# Patient Record
Sex: Male | Born: 1960 | Race: White | Hispanic: No | Marital: Married | Smoking: Never smoker
Health system: Southern US, Community
[De-identification: ages and names within clinical notes are randomized; demographics above are authoritative.]

## PROBLEM LIST (undated history)

## (undated) DIAGNOSIS — E785 Hyperlipidemia, unspecified: Secondary | ICD-10-CM

## (undated) DIAGNOSIS — Z8489 Family history of other specified conditions: Secondary | ICD-10-CM

## (undated) DIAGNOSIS — R51 Headache: Secondary | ICD-10-CM

## (undated) DIAGNOSIS — E7800 Pure hypercholesterolemia, unspecified: Secondary | ICD-10-CM

## (undated) DIAGNOSIS — I10 Essential (primary) hypertension: Secondary | ICD-10-CM

## (undated) DIAGNOSIS — Z9989 Dependence on other enabling machines and devices: Secondary | ICD-10-CM

## (undated) DIAGNOSIS — G4733 Obstructive sleep apnea (adult) (pediatric): Secondary | ICD-10-CM

## (undated) DIAGNOSIS — M1990 Unspecified osteoarthritis, unspecified site: Secondary | ICD-10-CM

## (undated) DIAGNOSIS — R519 Headache, unspecified: Secondary | ICD-10-CM

## (undated) DIAGNOSIS — E039 Hypothyroidism, unspecified: Secondary | ICD-10-CM

## (undated) HISTORY — PX: ANTERIOR CRUCIATE LIGAMENT REPAIR: SHX115

## (undated) HISTORY — PX: SHOULDER ARTHROSCOPY W/ ACROMIAL REPAIR: SUR94

## (undated) HISTORY — PX: BILATERAL CARPAL TUNNEL RELEASE: SHX6508

## (undated) HISTORY — PX: KNEE ARTHROSCOPY: SHX127

## (undated) HISTORY — PX: REPAIR ANKLE LIGAMENT: SUR1187

## (undated) HISTORY — PX: ARTHROSCOPIC REPAIR ACL: SUR80

## (undated) HISTORY — PX: COLONOSCOPY W/ POLYPECTOMY: SHX1380

## (undated) HISTORY — PX: KNEE ARTHROSCOPY: SUR90

---
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
## 1999-03-04 ENCOUNTER — Ambulatory Visit (HOSPITAL_BASED_OUTPATIENT_CLINIC_OR_DEPARTMENT_OTHER): Admission: RE | Admit: 1999-03-04 | Discharge: 1999-03-04 | Payer: Self-pay | Admitting: Orthopedic Surgery

## 1999-04-14 ENCOUNTER — Ambulatory Visit (HOSPITAL_BASED_OUTPATIENT_CLINIC_OR_DEPARTMENT_OTHER): Admission: RE | Admit: 1999-04-14 | Discharge: 1999-04-14 | Payer: Self-pay | Admitting: Orthopedic Surgery

## 2007-06-14 ENCOUNTER — Ambulatory Visit (HOSPITAL_BASED_OUTPATIENT_CLINIC_OR_DEPARTMENT_OTHER): Admission: RE | Admit: 2007-06-14 | Discharge: 2007-06-14 | Payer: Self-pay | Admitting: Orthopedic Surgery

## 2011-01-11 NOTE — Op Note (Signed)
NAMEMACKLIN, Darryl [name]                ACCOUNT NO.:  [id number]   MEDICAL RECORD NO.:  [id number]          PATIENT TYPE:  AMB   LOCATION:  DSC                          FACILITY:  [facility]   PHYSICIAN:  Loreta Ave, M.D. DATE OF BIRTH:  1961-07-19   DATE OF PROCEDURE:  06/14/2007  DATE OF DISCHARGE:  06/14/2007                               OPERATIVE REPORT   PREOPERATIVE DIAGNOSIS:  Left knee lateral meniscus tear.  Previous ACL  (anterior cruciate ligament) reconstruction.   POSTOPERATIVE DIAGNOSIS:  Left knee lateral meniscus tear.  Previous ACL  (anterior cruciate ligament) reconstruction with intact functional  graft.  Grade III, even some grade IV changes patellofemoral joint,  mostly on the trochlea.  Marked complex tearing lateral meniscus.  Previous partial medial meniscectomy without recurrent tears there.  Some grade II changes medial and lateral compartments.   PROCEDURE:  Left knee exam under anesthesia, arthroscopy, chondroplasty  primarily patellofemoral joint.  Removal of chondral loose bodies.  Extensive partial lateral meniscectomy.  Assessment of ACL graft.   SURGEON:  Loreta Ave, M.D.   ASSISTANT:  Genene Churn. Denton Meek.   ANESTHESIA:  General.   BLOOD LOSS:  Minimal.   SPECIMENS:  None.   CULTURES:  None.   COMPLICATIONS:  None.   DRESSING:  Soft compressive.   PROCEDURE IN DETAIL:  The patient was brought to the operating room,  placed on the operating table in supine position.  After adequate  anesthesia had been obtained, left knee examined.  Good endpoint with  Lachman and drawer.  No rotary instability.  All the ligaments stable.  Tourniquet and leg holder applied.  Leg prepped and draped in the usual  sterile fashion.  Three portals created, one superolateral, one each  mediolateral and parapatellar.  Inflow catheter introduced and knee was  distended and arthroscope introduced and knee inspected.  Good  patellofemoral tracking with only  grade II changes on the patella but  the whole lateral trochlea had grade III changes even some grade IV.  Debrided back to healthy tissue, removing all chondral loose bodies.  Tracking assessed and lateral release not indicated.  Some leftover  adhesions in front of the knee from previous intervention debrided.  Medial meniscus had had a partial meniscectomy without recurrent tears.  Some grade II changes there.  ACL graft intact and functional without  impingement or instability.  Laterally marked complex tearing of the  entire anterior half of the lateral meniscus and half of the posterior  half.  Entire anterior half removed of tip of medial meniscus removing  about half of the posterior half.  A little bit of grade II change on  the condyle.  At completion the entire knee  examined.  No other findings appreciated.  Instruments and fluid  removed.  Portals and knee injected with Marcaine.  Portals closed with  4-0 nylon.  Sterile compressive dressing applied.  Anesthesia reversed.  Brought to the recovery room.  Tolerated surgery well.  No  complications.      Loreta Ave, M.D.  Electronically Signed  DFM/MEDQ  D:  06/14/2007  T:  06/15/2007  Job:  [id number]

## 2011-06-08 LAB — POCT HEMOGLOBIN-HEMACUE
Hemoglobin: 14.6
Operator id: 128471

## 2011-07-26 ENCOUNTER — Other Ambulatory Visit: Payer: Self-pay | Admitting: Orthopedic Surgery

## 2011-07-26 ENCOUNTER — Ambulatory Visit
Admission: RE | Admit: 2011-07-26 | Discharge: 2011-07-26 | Disposition: A | Discharge status: Home / Self Care | Payer: BC Managed Care – PPO | Source: Ambulatory Visit | Attending: Orthopedic Surgery | Admitting: Orthopedic Surgery

## 2011-07-26 DIAGNOSIS — R52 Pain, unspecified: Secondary | ICD-10-CM

## 2017-08-14 NOTE — H&P (Signed)
Otolaryngology Clinic Note  HPI:    Darryl [name] is a 56 y.o. male patient of Maren ReamerJames Youngchull Kim, MD for evaluation of nasal obstruction.  He recalls that we saw him maybe 15+ years ago for sinus infections.  He uses an antihistamine daily, and has been using Flonase for maybe 4 years.  He does not think he has had a true sinus infection in several years.  He does not feel like he has allergies including itchy eyes, itchy skin, and nasal congestion.  He also has issues with his nose blocking up, especially nocturnally.  He uses CPAP for sleep apnea but does not use the humidifier.  In the morning, he will blow out some dark brownish red material.  He has not had any sinus x-rays.  He does not smoke.  He always seems to have extra phlegm in his throat.  He has a past history of reflux but this is improved when he uses his CPAP, as is his rest quality.  No facial pain or pressure.  No history of nasal polyps or asthma. PMH/Meds/All/SocHx/FamHx/ROS:   Past Medical History      Past Medical History:  Diagnosis Date  . Allergic rhinitis   . High cholesterol   . Obstructive sleep apnea       Past Surgical History       Past Surgical History:  Procedure Laterality Date  . CARPAL TUNNEL RELEASE    . FOOT SURGERY    . KNEE SURGERY    . SHOULDER SURGERY    . WISDOM TOOTH EXTRACTION        No family history of bleeding disorders, wound healing problems or difficulty with anesthesia.   Social History  Social History        Social History  . Marital status: N/A    Spouse name: N/A  . Number of children: N/A  . Years of education: N/A      Occupational History  . Not on file.       Social History Main Topics  . Smoking status: Never Smoker  . Smokeless tobacco: Never Used  . Alcohol use Yes  . Drug use: Unknown  . Sexual activity: Not on file       Other Topics Concern  . Not on file      Social History Narrative  . No narrative on file        Current Outpatient Prescriptions:  .  levothyroxine (SYNTHROID, LEVOTHROID) 100 MCG tablet, Take 100 mcg by mouth daily., Disp: , Rfl:  .  simvastatin (ZOCOR) 10 MG tablet, Take 10 mg by mouth daily., Disp: , Rfl:   A complete ROS was performed with pertinent positives/negatives noted in the HPI. The remainder of the ROS are negative.    Physical Exam:    BP (!) 148/102 (Site: Left arm)   Pulse 71   Ht 1.854 m (6\' 1" )   Wt (!) 137.9 kg (304 lb)   BMI 40.11 kg/m  He is tall and stocky.  Mental status is sharp.  He hears well in conversational speech.  Voice is clear and respirations unlabored through the nose.  The head is atraumatic and neck supple.  Cranial nerves intact.  Ear canals are clear with normal drums.  Anterior nose shows a straight septum with a small left chondral ethmoid spur.  He has moderately bulky inferior turbinates which are objectively and subjectively improved with Afrin spray.  No polyps or active drainage.  Oral cavity and pharynx  clear with teeth in good repair.  Neck unremarkable.    Noncontrast CT scan of the paranasal sinuses shows a small left chondral ethmoid spur as above.  He has essentially no sinus mucosal thickening.  Inferior turbinates are somewhat bulky.  Ostiomeatal complex is patent on both sides.  Impression & Plans:   Allergic rhinitis.  Obstructive sleep apnea.  Hypertrophic inferior turbinates.  Plan: If he is going to use an antihistamine every day, he should rotate to a different one each month.  I would like him to continue Flonase.  I have given him nasal hygiene instructions.  I definitely think he should use his humidifier on the CPAP machine.  He might try Nasalcrom prophylactically before allergy exposure.  I would have him consider reflux as a possible contribution to chronic pharyngeal phlegm.  He has consistent relief of his concerning symptoms with Afrin spray.  I think we could recommend bilateral SMR of  inferior turbinates.  I discussed this with him in basic detail including risks and complications.  Questions were answered and informed consent was obtained.  He has hydrocodone at home left over from carpal tunnel surgery.  Because he has sleep apnea, we will observe him 23 hours after his surgery, remove his nasal packs the following morning, and then discharge him to home in care of family.   [name], MD  07/18/2017

## 2017-08-15 ENCOUNTER — Other Ambulatory Visit: Payer: Self-pay

## 2017-08-15 ENCOUNTER — Encounter (HOSPITAL_COMMUNITY): Payer: Self-pay | Admitting: *Deleted

## 2017-08-16 ENCOUNTER — Ambulatory Visit (HOSPITAL_COMMUNITY): Payer: BC Managed Care – PPO | Admitting: Certified Registered Nurse Anesthetist

## 2017-08-16 ENCOUNTER — Encounter (HOSPITAL_COMMUNITY)
Admission: RE | Disposition: A | Discharge status: Home / Self Care | Payer: Self-pay | Source: Ambulatory Visit | Attending: Otolaryngology

## 2017-08-16 ENCOUNTER — Ambulatory Visit (HOSPITAL_COMMUNITY)
Admission: RE | Admit: 2017-08-16 | Discharge: 2017-08-17 | Disposition: A | Discharge status: Home / Self Care | Payer: BC Managed Care – PPO | Source: Ambulatory Visit | Attending: Otolaryngology | Admitting: Otolaryngology

## 2017-08-16 ENCOUNTER — Encounter (HOSPITAL_COMMUNITY): Payer: Self-pay | Admitting: *Deleted

## 2017-08-16 DIAGNOSIS — G4733 Obstructive sleep apnea (adult) (pediatric): Secondary | ICD-10-CM | POA: Diagnosis not present

## 2017-08-16 DIAGNOSIS — K219 Gastro-esophageal reflux disease without esophagitis: Secondary | ICD-10-CM | POA: Diagnosis not present

## 2017-08-16 DIAGNOSIS — J309 Allergic rhinitis, unspecified: Secondary | ICD-10-CM | POA: Diagnosis not present

## 2017-08-16 DIAGNOSIS — J343 Hypertrophy of nasal turbinates: Secondary | ICD-10-CM | POA: Insufficient documentation

## 2017-08-16 DIAGNOSIS — E7800 Pure hypercholesterolemia, unspecified: Secondary | ICD-10-CM | POA: Diagnosis not present

## 2017-08-16 HISTORY — DX: Unspecified osteoarthritis, unspecified site: M19.90

## 2017-08-16 HISTORY — DX: Family history of other specified conditions: Z84.89

## 2017-08-16 HISTORY — DX: Headache: R51

## 2017-08-16 HISTORY — PX: NASAL TURBINATE REDUCTION: SHX2072

## 2017-08-16 HISTORY — DX: Headache, unspecified: R51.9

## 2017-08-16 HISTORY — DX: Hypothyroidism, unspecified: E03.9

## 2017-08-16 HISTORY — DX: Dependence on other enabling machines and devices: Z99.89

## 2017-08-16 HISTORY — DX: Pure hypercholesterolemia, unspecified: E78.00

## 2017-08-16 HISTORY — DX: Obstructive sleep apnea (adult) (pediatric): G47.33

## 2017-08-16 LAB — CBC
HEMATOCRIT: 43.6 % (ref 39.0–52.0)
Hemoglobin: 14.8 g/dL (ref 13.0–17.0)
MCH: 31.1 pg (ref 26.0–34.0)
MCHC: 33.9 g/dL (ref 30.0–36.0)
MCV: 91.6 fL (ref 78.0–100.0)
PLATELETS: 176 10*3/uL (ref 150–400)
RBC: 4.76 MIL/uL (ref 4.22–5.81)
RDW: 13.1 % (ref 11.5–15.5)
WBC: 4.5 10*3/uL (ref 4.0–10.5)

## 2017-08-16 LAB — BASIC METABOLIC PANEL
ANION GAP: 6 (ref 5–15)
BUN: 22 mg/dL — ABNORMAL HIGH (ref 6–20)
CALCIUM: 8.8 mg/dL — AB (ref 8.9–10.3)
CO2: 28 mmol/L (ref 22–32)
CREATININE: 1.34 mg/dL — AB (ref 0.61–1.24)
Chloride: 106 mmol/L (ref 101–111)
GFR, EST NON AFRICAN AMERICAN: 58 mL/min — AB (ref >60)
Glucose, Bld: 128 mg/dL — ABNORMAL HIGH (ref 65–99)
Potassium: 5 mmol/L (ref 3.5–5.1)
Sodium: 140 mmol/L (ref 135–145)

## 2017-08-16 SURGERY — EXCISION, NASAL TURBINATE, SUBMUCOSAL
Anesthesia: General

## 2017-08-16 MED ORDER — MIDAZOLAM HCL 2 MG/2ML IJ SOLN
INTRAMUSCULAR | Status: AC
  Filled 2017-08-16: qty 2

## 2017-08-16 MED ORDER — ONDANSETRON HCL 4 MG/2ML IJ SOLN: 4.0000 mg | Freq: Four times a day (QID) | INTRAMUSCULAR | Status: DC | PRN

## 2017-08-16 MED ORDER — SURGILUBE EX GEL
CUTANEOUS | Status: DC | PRN
  Administered 2017-08-16: 1 via TOPICAL

## 2017-08-16 MED ORDER — FENTANYL CITRATE (PF) 250 MCG/5ML IJ SOLN
INTRAMUSCULAR | Status: DC | PRN
  Administered 2017-08-16: 100 ug via INTRAVENOUS
  Administered 2017-08-16: 50 ug via INTRAVENOUS

## 2017-08-16 MED ORDER — LIDOCAINE-EPINEPHRINE 1 %-1:100000 IJ SOLN
INTRAMUSCULAR | Status: AC
  Filled 2017-08-16: qty 1

## 2017-08-16 MED ORDER — MIDAZOLAM HCL 2 MG/2ML IJ SOLN
INTRAMUSCULAR | Status: DC | PRN
  Administered 2017-08-16 (×2): 1 mg via INTRAVENOUS

## 2017-08-16 MED ORDER — HYDROMORPHONE HCL 1 MG/ML IJ SOLN
0.2500 mg | INTRAMUSCULAR | Status: DC | PRN
  Administered 2017-08-16: 0.25 mg via INTRAVENOUS

## 2017-08-16 MED ORDER — IBUPROFEN 100 MG/5ML PO SUSP
400.0000 mg | Freq: Four times a day (QID) | ORAL | Status: DC | PRN
Start: 2017-08-16 — End: 2017-08-17

## 2017-08-16 MED ORDER — DEXAMETHASONE SODIUM PHOSPHATE 10 MG/ML IJ SOLN
INTRAMUSCULAR | Status: DC | PRN
  Administered 2017-08-16: 10 mg via INTRAVENOUS

## 2017-08-16 MED ORDER — CEFAZOLIN SODIUM-DEXTROSE 2-4 GM/100ML-% IV SOLN: 2.0000 g | INTRAVENOUS | Status: DC

## 2017-08-16 MED ORDER — OXYMETAZOLINE HCL 0.05 % NA SOLN
2.0000 | NASAL | Status: AC | PRN
  Administered 2017-08-16 (×2): 2 via NASAL
  Filled 2017-08-16: qty 15

## 2017-08-16 MED ORDER — LACTATED RINGERS IV SOLN
INTRAVENOUS | Status: DC
  Administered 2017-08-16: 08:00:00 via INTRAVENOUS

## 2017-08-16 MED ORDER — SUGAMMADEX SODIUM 200 MG/2ML IV SOLN
INTRAVENOUS | Status: DC | PRN
  Administered 2017-08-16: 300 mg via INTRAVENOUS

## 2017-08-16 MED ORDER — LEVOTHYROXINE SODIUM 100 MCG PO TABS
100.0000 ug | ORAL_TABLET | Freq: Every day | ORAL | Status: DC
  Filled 2017-08-16: qty 1

## 2017-08-16 MED ORDER — HYDROCODONE-ACETAMINOPHEN 5-325 MG PO TABS: 1.0000 | ORAL_TABLET | ORAL | Status: DC | PRN

## 2017-08-16 MED ORDER — 0.9 % SODIUM CHLORIDE (POUR BTL) OPTIME
TOPICAL | Status: DC | PRN
  Administered 2017-08-16: 1000 mL

## 2017-08-16 MED ORDER — HYDROCODONE-ACETAMINOPHEN 5-325 MG PO TABS: 2.0000 | ORAL_TABLET | Freq: Once | ORAL | Status: DC

## 2017-08-16 MED ORDER — LIDOCAINE 2% (20 MG/ML) 5 ML SYRINGE
INTRAMUSCULAR | Status: AC
  Filled 2017-08-16: qty 5

## 2017-08-16 MED ORDER — LIDOCAINE 2% (20 MG/ML) 5 ML SYRINGE
INTRAMUSCULAR | Status: DC | PRN
  Administered 2017-08-16: 50 mg via INTRAVENOUS

## 2017-08-16 MED ORDER — OXYMETAZOLINE HCL 0.05 % NA SOLN
NASAL | Status: AC
  Filled 2017-08-16: qty 15

## 2017-08-16 MED ORDER — LIDOCAINE-EPINEPHRINE 1 %-1:100000 IJ SOLN
INTRAMUSCULAR | Status: DC | PRN
  Administered 2017-08-16: 3 mL

## 2017-08-16 MED ORDER — PROPOFOL 10 MG/ML IV BOLUS
INTRAVENOUS | Status: DC | PRN
  Administered 2017-08-16: 200 mg via INTRAVENOUS

## 2017-08-16 MED ORDER — BACITRACIN ZINC 500 UNIT/GM EX OINT
TOPICAL_OINTMENT | CUTANEOUS | Status: AC
  Filled 2017-08-16: qty 28.35

## 2017-08-16 MED ORDER — OXYMETAZOLINE HCL 0.05 % NA SOLN
NASAL | Status: DC | PRN
  Administered 2017-08-16: 1

## 2017-08-16 MED ORDER — ROCURONIUM BROMIDE 10 MG/ML (PF) SYRINGE
PREFILLED_SYRINGE | INTRAVENOUS | Status: DC | PRN
  Administered 2017-08-16: 50 mg via INTRAVENOUS

## 2017-08-16 MED ORDER — HYDROMORPHONE HCL 1 MG/ML IJ SOLN
INTRAMUSCULAR | Status: AC
  Filled 2017-08-16: qty 1

## 2017-08-16 MED ORDER — DEXTROSE-NACL 5-0.45 % IV SOLN
INTRAVENOUS | Status: DC
Start: 2017-08-16 — End: 2017-08-17
  Administered 2017-08-16: 13:00:00 via INTRAVENOUS

## 2017-08-16 MED ORDER — ONDANSETRON HCL 4 MG/2ML IJ SOLN
INTRAMUSCULAR | Status: DC | PRN
  Administered 2017-08-16: 4 mg via INTRAVENOUS

## 2017-08-16 MED ORDER — PROPOFOL 10 MG/ML IV BOLUS
INTRAVENOUS | Status: AC
  Filled 2017-08-16: qty 20

## 2017-08-16 MED ORDER — PHENOL 1.4 % MT LIQD
1.0000 | OROMUCOSAL | Status: DC | PRN
Start: 2017-08-16 — End: 2017-08-17
  Administered 2017-08-16: 1 via OROMUCOSAL
  Filled 2017-08-16 (×2): qty 177

## 2017-08-16 MED ORDER — OXYMETAZOLINE HCL 0.05 % NA SOLN: 2.0000 | NASAL | Status: DC | PRN

## 2017-08-16 MED ORDER — CARBOXYMETHYLCELLUL-GLYCERIN 0.5-0.9 % OP SOLN: 2.0000 [drp] | Freq: Three times a day (TID) | OPHTHALMIC | Status: DC

## 2017-08-16 MED ORDER — CEFAZOLIN SODIUM-DEXTROSE 1-4 GM/50ML-% IV SOLN
1.0000 g | Freq: Three times a day (TID) | INTRAVENOUS | Status: DC
  Administered 2017-08-16 – 2017-08-17 (×3): 1 g via INTRAVENOUS
  Filled 2017-08-16 (×4): qty 50

## 2017-08-16 MED ORDER — ROCURONIUM BROMIDE 10 MG/ML (PF) SYRINGE
PREFILLED_SYRINGE | INTRAVENOUS | Status: AC
  Filled 2017-08-16: qty 5

## 2017-08-16 MED ORDER — FENTANYL CITRATE (PF) 250 MCG/5ML IJ SOLN
INTRAMUSCULAR | Status: AC
  Filled 2017-08-16: qty 5

## 2017-08-16 MED ORDER — BACITRACIN ZINC 500 UNIT/GM EX OINT
TOPICAL_OINTMENT | CUTANEOUS | Status: DC | PRN
  Administered 2017-08-16: 1 via TOPICAL

## 2017-08-16 MED ORDER — CEFAZOLIN SODIUM-DEXTROSE 2-4 GM/100ML-% IV SOLN
2.0000 g | INTRAVENOUS | Status: AC
  Administered 2017-08-16: 2 g via INTRAVENOUS
  Filled 2017-08-16: qty 100

## 2017-08-16 SURGICAL SUPPLY — 49 items
BALL CTTN LRG ABS STRL LF (GAUZE/BANDAGES/DRESSINGS) ×1
BLADE RAD40 ROTATE 4M 4 5PK (BLADE) IMPLANT
BLADE RAD40 ROTATE 4M 4MM 5PK (BLADE)
BLADE RAD60 ROTATE M4 4 5PK (BLADE) IMPLANT
BLADE RAD60 ROTATE M4 4MM 5PK (BLADE)
BLADE SURG 15 STRL LF DISP TIS (BLADE) IMPLANT
BLADE SURG 15 STRL SS (BLADE)
BLADE TRICUT ROTATE M4 4 5PK (BLADE) IMPLANT
BLADE TRICUT ROTATE M4 4MM 5PK (BLADE)
CANISTER SUCT 3000ML PPV (MISCELLANEOUS) ×3 IMPLANT
COAGULATOR SUCT 6 FR SWTCH (ELECTROSURGICAL) ×1
COAGULATOR SUCT SWTCH 10FR 6 (ELECTROSURGICAL) ×2 IMPLANT
CONT SPEC 4OZ CLIKSEAL STRL BL (MISCELLANEOUS) ×2 IMPLANT
COTTONBALL LRG STERILE PKG (GAUZE/BANDAGES/DRESSINGS) ×3 IMPLANT
CRADLE DONUT ADULT HEAD (MISCELLANEOUS) ×2 IMPLANT
DECANTER SPIKE VIAL GLASS SM (MISCELLANEOUS) ×1 IMPLANT
DRAPE HALF SHEET 40X57 (DRAPES) IMPLANT
DRSG TELFA 3X8 NADH (GAUZE/BANDAGES/DRESSINGS) ×3 IMPLANT
ELECT REM PT RETURN 9FT ADLT (ELECTROSURGICAL) ×3
ELECTRODE REM PT RTRN 9FT ADLT (ELECTROSURGICAL) ×1 IMPLANT
GAUZE PACKING FOLDED 2  STR (GAUZE/BANDAGES/DRESSINGS) ×2
GAUZE PACKING FOLDED 2 STR (GAUZE/BANDAGES/DRESSINGS) ×1 IMPLANT
GAUZE SPONGE 2X2 8PLY STRL LF (GAUZE/BANDAGES/DRESSINGS) ×1 IMPLANT
GAUZE SPONGE 4X4 12PLY STRL (GAUZE/BANDAGES/DRESSINGS) ×2 IMPLANT
GLOVE ECLIPSE 8.0 STRL XLNG CF (GLOVE) ×3 IMPLANT
GOWN STRL REUS W/ TWL LRG LVL3 (GOWN DISPOSABLE) ×1 IMPLANT
GOWN STRL REUS W/ TWL XL LVL3 (GOWN DISPOSABLE) ×1 IMPLANT
GOWN STRL REUS W/TWL LRG LVL3 (GOWN DISPOSABLE) ×3
GOWN STRL REUS W/TWL XL LVL3 (GOWN DISPOSABLE) ×3
IRRIGATOR 4MM STR (IRRIGATION / IRRIGATOR) IMPLANT
KIT BASIN OR (CUSTOM PROCEDURE TRAY) ×3 IMPLANT
KIT ROOM TURNOVER OR (KITS) ×3 IMPLANT
NDL HYPO 25GX1X1/2 BEV (NEEDLE) IMPLANT
NDL SPNL 25GX3.5 QUINCKE BL (NEEDLE) ×1 IMPLANT
NEEDLE HYPO 25GX1X1/2 BEV (NEEDLE) IMPLANT
NEEDLE SPNL 25GX3.5 QUINCKE BL (NEEDLE) ×3 IMPLANT
NS IRRIG 1000ML POUR BTL (IV SOLUTION) ×3 IMPLANT
PAD ARMBOARD 7.5X6 YLW CONV (MISCELLANEOUS) ×6 IMPLANT
PAD DRESSING TELFA 3X8 NADH (GAUZE/BANDAGES/DRESSINGS) ×1 IMPLANT
PATTIES SURGICAL .5 X3 (DISPOSABLE) ×3 IMPLANT
SHEET SIL 040 (INSTRUMENTS) ×3 IMPLANT
SPECIMEN JAR SMALL (MISCELLANEOUS) ×3 IMPLANT
SPONGE GAUZE 2X2 STER 10/PKG (GAUZE/BANDAGES/DRESSINGS) ×2
SUT CHROMIC 4 0 P 3 18 (SUTURE) ×1 IMPLANT
SUT ETHILON 3 0 PS 1 (SUTURE) ×3 IMPLANT
SUT PDS AB 4-0 P3 18 (SUTURE) ×1 IMPLANT
SUT PLAIN 4 0 ~~LOC~~ 1 (SUTURE) IMPLANT
TRAY ENT MC OR (CUSTOM PROCEDURE TRAY) ×3 IMPLANT
WATER STERILE IRR 1000ML POUR (IV SOLUTION) ×1 IMPLANT

## 2017-08-16 NOTE — Op Note (Signed)
08/16/2017  11:24 AM    Yvonna AlanisWalton, Thales  [id number]   Pre-Op Dx:   Hypertrophic Inferior Turbinates  Post-op Dx: Same plus LEFT chondroethmoid spur  Proc:  Bilateral SMR Inferior Turbinates , limited Nasal Septoplasty,  Surg:  Flo ShanksWOLICKI, Chioke Noxon T MD  Anes:  GOT  EBL:  min  Comp:  none  Findings:  Prominent bulky inferior turbinates.  Prominent LEFT bony chondroethmoid spur  Procedure: With the patient in a comfortable supine position,  general orotracheal anesthesia was induced without difficulty.     The patient received preoperative Afrin spray for topical decongestion and vasoconstriction.  Intravenous prophylactic antibiotics were administered.  At an appropriate level, the patient was placed in a semi-sitting position.  A saline moistened throat pack was placed.  Nasal vibrissae were trimmed.     A sterile preparation and draping of the midface was accomplished in the standard fashion.   The nose was inspected with a headlight with the findings as described above.   the inferior turbinates were each infiltrated with  1% Xylocaine with 1:100,000 epinephrine,  6 cc's total.  The left submucoperichondrial plane of the septum was infiltrated in the vicinity of the spur.  Beginning on the right side, the turbinate was infractured.  The anterior hood was sharply lysed.  The medial mucosa was incised and an anterior upsloping fashion.  A laterally based flap was developed.  Turbinate bone and lateral mucosa were resected in a posterior downsloping fashion, removing virtually all of the leaving virtually all the posterior pole.  Additional bony spicules were dissected and removed.  Flap was laid back down.  The cut mucosal edges and the posterior pole were suction coagulated.  After completing the right inferior turbinate, the left side was done in identical fashion.  A Killian incision was made and submucosal dissection over the left chondral ethmoid spur was performed.  Mallet and  osteotome were used to loosen the bony spur which was locked and delivered using a Takahashi forceps.  This was done in several pieces.  The spur was adequately removed.  Hemostasis was observed.     Telfa packs impregnated with bacitracin ointment were placed between the septum and the inferior turbinates, one on each side, for hemostasis and support.  A 7 mm nasal trumpet was shortened and placed on each side to allow some postoperative airway.   At this point the procedure was completed.  The pharynx was suctioned free and the throat pack was removed.   The patient was returned to anesthesia, awakened, extubated, and transferred to recovery in stable condition.  Dispo:   PACU to 23 hr observation given OSA.  Plan: Ice, elevation, narcotic analgesia, prophylactic antibiotics for the duration of indwelling nasal foreign bodies.  We will remove the nasal packing In one day,  Return to work or school in 10 days, strenuous activities in two weeks.  Flo ShanksWOLICKI,  Brittin Janik T MD  08/16/2017  11:24 AM

## 2017-08-16 NOTE — Transfer of Care (Signed)
Immediate Anesthesia Transfer of Care Note  Patient: Darryl [name]  Procedure(s) Performed: BILATERAL INFERIOR TURBINATE REDUCTION/SUBMUCOSAL RESECTION (N/A )  Patient Location: PACU  Anesthesia Type:General  Level of Consciousness: oriented, drowsy and patient cooperative  Airway & Oxygen Therapy: Patient Spontanous Breathing and Patient connected to face mask oxygen  Post-op Assessment: Report given to RN, Post -op Vital signs reviewed and stable and Patient moving all extremities X 4  Post vital signs: Reviewed and stable  Last Vitals:  Vitals:   08/16/17 0751  Pulse: 71  Resp: 18  Temp: 36.6 C  SpO2: (!) 71%    Last Pain:  Vitals:   08/16/17 0751  TempSrc: Oral         Complications: No apparent anesthesia complications

## 2017-08-16 NOTE — Anesthesia Preprocedure Evaluation (Addendum)
Anesthesia Evaluation  Patient identified by MRN, date of birth, ID band Patient awake    Reviewed: Allergy & Precautions, NPO status , Patient's Chart, lab work & pertinent test results  Airway Mallampati: II   Neck ROM: full    Dental  (+) Dental Advisory Given, Teeth Intact   Pulmonary neg pulmonary ROS,    breath sounds clear to auscultation       Cardiovascular negative cardio ROS   Rhythm:regular Rate:Normal     Neuro/Psych    GI/Hepatic   Endo/Other  Hypothyroidism Morbid obesity  Renal/GU      Musculoskeletal  (+) Arthritis ,   Abdominal   Peds  Hematology   Anesthesia Other Findings   Reproductive/Obstetrics                            Anesthesia Physical Anesthesia Plan  ASA: II  Anesthesia Plan: General   Post-op Pain Management:    Induction: Intravenous  PONV Risk Score and Plan: 2 and Ondansetron, Dexamethasone, Midazolam and Treatment may vary due to age or medical condition  Airway Management Planned: Oral ETT  Additional Equipment:   Intra-op Plan:   Post-operative Plan: Extubation in OR  Informed Consent: I have reviewed the patients History and Physical, chart, labs and discussed the procedure including the risks, benefits and alternatives for the proposed anesthesia with the patient or authorized representative who has indicated his/her understanding and acceptance.   Dental advisory given  Plan Discussed with: CRNA, Anesthesiologist and Surgeon  Anesthesia Plan Comments:        Anesthesia Quick Evaluation

## 2017-08-16 NOTE — Interval H&P Note (Signed)
History and Physical Interval Note:  08/16/2017 9:51 AM  Darryl [name]  has presented today for surgery, with the diagnosis of hypertrophic turbinates  The various methods of treatment have been discussed with the patient and family. After consideration of risks, benefits and other options for treatment, the patient has consented to  Procedure(s): INFERIOR TURBINATE REDUCTION/SUBMUCOSAL RESECTION (N/A) as a surgical intervention .  The patient's history has been re-reviewed, patient re-examined, no change in status, stable for surgery.  I have re-reviewed the patient's chart and labs.  Questions were answered to the patient's satisfaction.     Flo ShanksWOLICKI, Akshay Spang

## 2017-08-16 NOTE — Anesthesia Procedure Notes (Signed)
Procedure Name: Intubation Date/Time: 08/16/2017 10:29 AM Performed by: Julieta Bellini, CRNA Pre-anesthesia Checklist: Patient identified, Emergency Drugs available, Suction available and Patient being monitored Patient Re-evaluated:Patient Re-evaluated prior to induction Oxygen Delivery Method: Circle system utilized Preoxygenation: Pre-oxygenation with 100% oxygen Induction Type: IV induction Ventilation: Mask ventilation with difficulty, Oral airway inserted - appropriate to patient size and Two handed mask ventilation required Laryngoscope Size: Mac and 4 Grade View: Grade I Tube type: Oral Tube size: 7.5 mm Number of attempts: 1 Airway Equipment and Method: Stylet Placement Confirmation: ETT inserted through vocal cords under direct vision,  breath sounds checked- equal and bilateral and positive ETCO2 Secured at: 23 cm Tube secured with: Tape Dental Injury: Teeth and Oropharynx as per pre-operative assessment

## 2017-08-17 DIAGNOSIS — J343 Hypertrophy of nasal turbinates: Secondary | ICD-10-CM | POA: Diagnosis not present

## 2017-08-17 NOTE — Discharge Summary (Signed)
08/17/2017 1:54 PM  Yvonna AlanisWalton, Maverick [id number]  Post-Op Day 1    Temp:  [97.6 F (36.4 C)-98.5 F (36.9 C)] 98.1 F (36.7 C) (12/20 1024) Pulse Rate:  [73-90] 78 (12/20 1024) Resp:  [16-17] 16 (12/20 1024) BP: (158-164)/(88-100) 164/88 (12/20 1024) SpO2:  [94 %-98 %] 97 % (12/20 1024),     Intake/Output Summary (Last 24 hours) at 08/17/2017 1354 Last data filed at 08/17/2017 1024 Gross per 24 hour  Intake 1883.33 ml  Output 0 ml  Net 1883.33 ml    No results found for this or any previous visit (from the past 24 hour(s)).  SUBJECTIVE:  No pain.  Was not given po analgesics this AM as prescribed prior to pulling packs.   OBJECTIVE:  Color/energy good.  Nasal packs removed without difficulty.  Hemostasis spontaneous.  IMPRESSION:  Satisfactory check  PLAN:  Discharge home  Admit:  19 DEC Discharge:  20 DEC Final Diagnosis:  Obstructive hypertrophic inferior turbinates Proc:  Bilateral smr turbinates, limited septoplasty 19 DEC Comp:  None Cond:  Ambulatory, pain controlled. Packs out.  Eating and drinking. Voiding. Rx:  Hydrocodone Instructions written and given  Hosp:  Course:  Admitted post op observation given known OSA.  No O2 sat problems,  Packs out AM POD 1 and discharged to home and care of pt.   Flo ShanksWOLICKI, Quiana Cobaugh

## 2017-08-17 NOTE — Progress Notes (Signed)
Discharged home with mother via wheelchair. Discharged instructions, personal belongings given to patient. Verbalized understanding of instructions.

## 2017-08-17 NOTE — Anesthesia Postprocedure Evaluation (Signed)
Anesthesia Post Note  Patient: Darryl [name]  Procedure(s) Performed: BILATERAL INFERIOR TURBINATE REDUCTION/SUBMUCOSAL RESECTION (N/A )     Patient location during evaluation: PACU Anesthesia Type: General Level of consciousness: awake and alert Pain management: pain level controlled Vital Signs Assessment: post-procedure vital signs reviewed and stable Respiratory status: spontaneous breathing, nonlabored ventilation, respiratory function stable and patient connected to nasal cannula oxygen Cardiovascular status: blood pressure returned to baseline and stable Postop Assessment: no apparent nausea or vomiting Anesthetic complications: no    Last Vitals:  Vitals:   08/17/17 0036 08/17/17 0500  BP: (!) 160/89 (!) 160/100  Pulse: 83 73  Resp: 16 16  Temp: 36.6 C 36.6 C  SpO2: 94% 97%    Last Pain:  Vitals:   08/17/17 0500  TempSrc: Oral  PainSc:                  [name]

## 2017-08-18 ENCOUNTER — Encounter (HOSPITAL_COMMUNITY): Payer: Self-pay | Admitting: Otolaryngology

## 2018-12-17 ENCOUNTER — Telehealth: Payer: Self-pay

## 2018-12-21 ENCOUNTER — Other Ambulatory Visit: Payer: Self-pay

## 2018-12-21 ENCOUNTER — Encounter: Payer: Self-pay | Admitting: Cardiology

## 2018-12-21 ENCOUNTER — Ambulatory Visit: Payer: BC Managed Care – PPO | Admitting: Cardiology

## 2018-12-21 VITALS — BP 167/90 | HR 71 | Ht 73.0 in | Wt 300.0 lb

## 2018-12-21 DIAGNOSIS — R9431 Abnormal electrocardiogram [ECG] [EKG]: Secondary | ICD-10-CM | POA: Diagnosis not present

## 2018-12-21 DIAGNOSIS — Z6841 Body Mass Index (BMI) 40.0 and over, adult: Secondary | ICD-10-CM

## 2018-12-21 DIAGNOSIS — E7800 Pure hypercholesterolemia, unspecified: Secondary | ICD-10-CM

## 2018-12-21 DIAGNOSIS — R739 Hyperglycemia, unspecified: Secondary | ICD-10-CM

## 2018-12-21 DIAGNOSIS — I10 Essential (primary) hypertension: Secondary | ICD-10-CM

## 2018-12-21 MED ORDER — BUPROPION HCL ER (XL) 150 MG PO TB24: 150.0000 mg | ORAL_TABLET | ORAL | 2 refills | Status: DC

## 2018-12-21 MED ORDER — AMLODIPINE BESYLATE 10 MG PO TABS: 10.0000 mg | ORAL_TABLET | Freq: Every day | ORAL | 2 refills | Status: DC

## 2018-12-21 NOTE — Progress Notes (Signed)
Virtual Visit via Video Note: This visit type was conducted due to national recommendations for restrictions regarding the COVID-19 Pandemic (e.g. social distancing).  This format is felt to be most appropriate for this patient at this time.  All issues noted in this document were discussed and addressed.  No physical exam was performed (except for noted visual exam findings with Telehealth visits).  The patient has consented to conduct a Telehealth visit and understands insurance will be billed.   I connected with@, on 12/23/18 at  by a video enabled telemedicine application and verified that I am speaking with the correct person using two identifiers.   I discussed the limitations of evaluation and management by telemedicine and the availability of in person appointments. The patient expressed understanding and agreed to proceed.   I have discussed with patient regarding the safety during COVID Pandemic and steps and precautions to be taken including social distancing, frequent hand wash and use of detergent soap, gels with the patient. I asked the patient to avoid touching mouth, nose, eyes, ears with the hands. I encouraged regular walking around the neighborhood and exercise and regular diet, as long as social distancing can be maintained.   Primary Physician/Referring:  Pearson Grippe, MD  Patient ID: Darryl [name], male    DOB: [birth year], 58 y.o.   MRN: [id number]  Chief Complaint  Patient presents with   Abnormal ECG   Shortness of Breath    HPI: Darryl [name]  is a 58 y.o. male  with Patient with hypertension, hyperlipidemia, hyperglycemia, morbid obesity and obstructive sleep apnea on CPAP referred to me for evaluation of difficult to control hypertension and also abnormal EKG.  Patient with chronic degenerative joint disease morbid obesity, has been having chronic shortness of breath and recently has had difficulty in controlling his hypertension.  He denies any PND or orthopnea.   He no chest pain or palpitations.  Past Medical History:  Diagnosis Date   Arthritis    "hands, knees" (08/16/2017)   Family history of adverse reaction to anesthesia    Father - younger - aggresive- no problem later    High cholesterol    Hypothyroidism    OSA on CPAP    Sinus headache    "a few/year" (08/16/2017)    Past Surgical History:  Procedure Laterality Date   ANTERIOR CRUCIATE LIGAMENT REPAIR Left    ARTHROSCOPIC REPAIR ACL     BILATERAL CARPAL TUNNEL RELEASE Bilateral 10/2016-03/2017   right-left   COLONOSCOPY W/ POLYPECTOMY     KNEE ARTHROSCOPY Left X 2   bucket tear, Menisus   KNEE ARTHROSCOPY Right X 2   NASAL TURBINATE REDUCTION Bilateral 08/16/2017    INFERIOR TURBINATE REDUCTION/SUBMUCOSAL RESECTION/notes 08/16/2017   NASAL TURBINATE REDUCTION N/A 08/16/2017   Procedure: BILATERAL INFERIOR TURBINATE REDUCTION/SUBMUCOSAL RESECTION;  Surgeon: Flo Shanks, MD;  Location: [facility] OR;  Service: ENT;  Laterality: N/A;   REPAIR ANKLE LIGAMENT Right X 2   "reconstruction"   SHOULDER ARTHROSCOPY W/ ACROMIAL REPAIR Bilateral     Social History   Socioeconomic History   Marital status: Married    Spouse name: Not on file   Number of children: Not on file   Years of education: Not on file   Highest education level: Not on file  Occupational History   Not on file  Social Needs   Financial resource strain: Not on file   Food insecurity:    Worry: Not on file    Inability: Not on file  Transportation needs:    Medical: Not on file    Non-medical: Not on file  Tobacco Use   Smoking status: Never Smoker   Smokeless tobacco: Never Used  Substance and Sexual Activity   Alcohol use: Yes    Comment: 08/16/2017 "once q couple months"   Drug use: No   Sexual activity: Yes  Lifestyle   Physical activity:    Days per week: Not on file    Minutes per session: Not on file   Stress: Not on file  Relationships   Social connections:     Talks on phone: Not on file    Gets together: Not on file    Attends religious service: Not on file    Active member of club or organization: Not on file    Attends meetings of clubs or organizations: Not on file    Relationship status: Not on file   Intimate partner violence:    Fear of current or ex partner: Not on file    Emotionally abused: Not on file    Physically abused: Not on file    Forced sexual activity: Not on file  Other Topics Concern   Not on file  Social History Narrative   Not on file    Current Outpatient Medications on File Prior to Visit  Medication Sig Dispense Refill   Ascorbic Acid (VITAMIN C) 1000 MG tablet Take 1,000 mg by mouth daily.     aspirin EC 81 MG tablet Take 81 mg by mouth daily.     b complex vitamins tablet Take 2 tablets by mouth daily.     cholecalciferol (VITAMIN D3) 25 MCG (1000 UT) tablet Take 1,000 Units by mouth daily.     CINNAMON PO Take 2,000 mg by mouth daily.     Coenzyme Q10 300 MG CAPS Take 300 mg by mouth daily.     Glucosamine-Chondroit-Vit C-Mn (GLUCOSAMINE 1500 COMPLEX) CAPS Take 2 capsules by mouth daily.     levothyroxine (SYNTHROID, LEVOTHROID) 100 MCG tablet Take 100 mcg by mouth at bedtime.     Linoleic Acid-Sunflower Oil (CLA PO) Take 500 mg by mouth daily.     losartan-hydrochlorothiazide (HYZAAR) 100-25 MG tablet Take 1 tablet by mouth daily.     naproxen sodium (ALEVE) 220 MG tablet Take 440 mg by mouth daily as needed (pain).     Omega-3 Fatty Acids (OMEGA 3 500 PO) Take 1,000 mg by mouth daily.     testosterone cypionate (DEPOTESTOSTERONE CYPIONATE) 200 MG/ML injection Inject 300 mg into the muscle every 21 ( twenty-one) days.     vitamin E 400 UNIT capsule Take 400 Units by mouth daily.     vitamin k 100 MCG tablet Take 100 mcg by mouth daily.     simvastatin (ZOCOR) 10 MG tablet Take 10 mg by mouth daily.     No current facility-administered medications on file prior to visit.     Review of  Systems  Constitution: Negative for chills, decreased appetite, malaise/fatigue and weight gain.  Cardiovascular: Positive for dyspnea on exertion. Negative for leg swelling and syncope.  Respiratory: Positive for shortness of breath and sleep disturbances due to breathing (on CPAP and compliant).   Endocrine: Negative for cold intolerance.  Hematologic/Lymphatic: Does not bruise/bleed easily.  Musculoskeletal: Positive for arthritis, back pain and joint pain. Negative for joint swelling.  Gastrointestinal: Negative for abdominal pain, anorexia and change in bowel habit.  Genitourinary: Positive for decreased libido.  Neurological: Negative for headaches and light-headedness.  Psychiatric/Behavioral: Negative for depression and substance abuse.  All other systems reviewed and are negative.     Objective:  Blood pressure (!) 167/90, pulse 71, height 6\' 1"  (1.854 m), weight 300 lb (136.1 kg). Body mass index is 39.58 kg/m. Physical exam not performed or limited due to virtual visit.    Physical Exam  Constitutional: He appears well-developed. No distress.  Morbidly obese  Neck: Normal range of motion. Neck supple.  Pulmonary/Chest: Effort normal.  Musculoskeletal: Normal range of motion.        General: No edema.  Psychiatric: He has a normal mood and affect.   Radiology: No results found. Laboratory Examination:    CMP Latest Ref Rng & Units 08/16/2017  Glucose 65 - 99 mg/dL 286(N)  BUN 6 - 20 mg/dL 81(R)  Creatinine 7.11 - 1.24 mg/dL 6.57(X)  Sodium 038 - 333 mmol/L 140  Potassium 3.5 - 5.1 mmol/L 5.0  Chloride 101 - 111 mmol/L 106  CO2 22 - 32 mmol/L 28  Calcium 8.9 - 10.3 mg/dL 8.3(A)   CBC Latest Ref Rng & Units 08/16/2017 06/14/2007  WBC 4.0 - 10.5 K/uL 4.5 -  Hemoglobin 13.0 - 17.0 g/dL 91.9 16.6  Hematocrit 06.0 - 52.0 % 43.6 -  Platelets 150 - 400 K/uL 176 -   Lipid Panel  No results found for: CHOL, TRIG, HDL, CHOLHDL, VLDL, LDLCALC, LDLDIRECT HEMOGLOBIN  A1C No results found for: HGBA1C, MPG TSH No results for input(s): TSH in the last 8760 hours.  Cardiac studies:   None  Assessment:    Essential hypertension - Plan: PCV ECHOCARDIOGRAM COMPLETE, amLODipine (NORVASC) 10 MG tablet  Nonspecific abnormal electrocardiogram (ECG) (EKG) - Plan: PCV ECHOCARDIOGRAM COMPLETE  Class 3 severe obesity due to excess calories without serious comorbidity with body mass index (BMI) of 40.0 to 44.9 in adult [facility]) - Plan: buPROPion (WELLBUTRIN XL) 150 MG 24 hr tablet  Hyperglycemia  Hypercholesteremia  PCP EKG 0/17/2020: Probable lead misplacement, I will assume no misplacement,  Normal sinus rhythm at rate of 67 bpm, right axis deviation, RVH.  High lateral ischemia.  Recommendations:    Patient with hypertension, hyperlipidemia, hyperglycemia, morbid obesity and obstructive sleep apnea on CPAP referred to me for evaluation of difficult to control hypertension and also abnormal EKG.  Patient's activity has been limited due to arthritis and also he has reduced his activity since Covid 19.  I have added amlodipine 10 mg daily, I have discussed the side effects of leg edema.  He is on losartan which he will continue.  With regard to obesity, he is presently on stable dose of testosterone, no recent change, CBC and CMP have been stable per patient, I would like to try Wellbutrin SR 24 hour formulation 150 mg daily for weight loss, he'll start with one half tablet for 3-4 days.  I'll obtain an echocardiogram to evaluate his LV function and I would like to see him in the clinic in 4-6 weeks for follow-up.  He will need ischemic workup at some point but would like to see the patient 1st.  Yates Decamp, MD, [facility] 12/23/2018, 4:00 PM [facility] [place] Pager: [phone] Office: [phone] If no answer Cell [phone]

## 2019-01-10 ENCOUNTER — Other Ambulatory Visit: Payer: BC Managed Care – PPO

## 2019-01-10 ENCOUNTER — Ambulatory Visit (INDEPENDENT_AMBULATORY_CARE_PROVIDER_SITE_OTHER): Payer: BC Managed Care – PPO

## 2019-01-10 ENCOUNTER — Other Ambulatory Visit: Payer: Self-pay

## 2019-01-10 DIAGNOSIS — I10 Essential (primary) hypertension: Secondary | ICD-10-CM

## 2019-01-10 DIAGNOSIS — R9431 Abnormal electrocardiogram [ECG] [EKG]: Secondary | ICD-10-CM

## 2019-01-11 ENCOUNTER — Other Ambulatory Visit: Payer: Self-pay | Admitting: Urology

## 2019-01-15 ENCOUNTER — Other Ambulatory Visit: Payer: Self-pay

## 2019-01-15 DIAGNOSIS — I10 Essential (primary) hypertension: Secondary | ICD-10-CM

## 2019-01-15 MED ORDER — AMLODIPINE BESYLATE 10 MG PO TABS: 10.0000 mg | ORAL_TABLET | Freq: Every day | ORAL | 1 refills | Status: DC

## 2019-01-30 ENCOUNTER — Ambulatory Visit: Payer: BC Managed Care – PPO | Admitting: Cardiology

## 2019-01-30 ENCOUNTER — Other Ambulatory Visit: Payer: Self-pay

## 2019-01-30 ENCOUNTER — Encounter: Payer: Self-pay | Admitting: Cardiology

## 2019-01-30 VITALS — BP 130/94 | HR 79 | Ht 73.0 in | Wt 304.0 lb

## 2019-01-30 DIAGNOSIS — E785 Hyperlipidemia, unspecified: Secondary | ICD-10-CM

## 2019-01-30 DIAGNOSIS — I10 Essential (primary) hypertension: Secondary | ICD-10-CM | POA: Diagnosis not present

## 2019-01-30 DIAGNOSIS — R0609 Other forms of dyspnea: Secondary | ICD-10-CM

## 2019-01-30 DIAGNOSIS — E7800 Pure hypercholesterolemia, unspecified: Secondary | ICD-10-CM | POA: Diagnosis not present

## 2019-01-30 DIAGNOSIS — Z6841 Body Mass Index (BMI) 40.0 and over, adult: Secondary | ICD-10-CM

## 2019-01-30 HISTORY — DX: Essential (primary) hypertension: I10

## 2019-01-30 HISTORY — DX: Hyperlipidemia, unspecified: E78.5

## 2019-01-30 NOTE — Progress Notes (Signed)
Primary Physician/Referring:  Pearson Grippe, MD  Patient ID: Darryl [name], male    DOB: 1961-01-13, 58 y.o.   MRN: [id number]  Chief Complaint  Patient presents with  . Hypertension  . Follow-up    HPI: Darryl [name]  is a 58 y.o. male  with Patient with hypertension, hyperlipidemia, hyperglycemia, morbid obesity and obstructive sleep apnea on CPAP referred to me for evaluation of difficult to control hypertension and also abnormal EKG. I had seen him on a virtual visit 6 weeks ago and obtained an Echo. He now presents for f/u. His father present.   Patient with chronic degenerative joint disease morbid obesity, has been having chronic shortness of breath and recently has had difficulty in controlling his hypertension.  He denies any PND or orthopnea.  He no chest pain or palpitations. No new symptoms since last OV, compliant with CPAP.   Past Medical History:  Diagnosis Date  . Arthritis    "hands, knees" (08/16/2017)  . Family history of adverse reaction to anesthesia    Father - younger - aggresive- no problem later   . High cholesterol   . HTN (hypertension) 01/30/2019  . Hyperlipemia 01/30/2019  . Hypothyroidism   . OSA on CPAP   . Sinus headache    "a few/year" (08/16/2017)    Past Surgical History:  Procedure Laterality Date  . ANTERIOR CRUCIATE LIGAMENT REPAIR Left   . ARTHROSCOPIC REPAIR ACL    . BILATERAL CARPAL TUNNEL RELEASE Bilateral 10/2016-03/2017   right-left  . COLONOSCOPY W/ POLYPECTOMY    . KNEE ARTHROSCOPY Left X 2   bucket tear, Menisus  . KNEE ARTHROSCOPY Right X 2  . NASAL TURBINATE REDUCTION Bilateral 08/16/2017    INFERIOR TURBINATE REDUCTION/SUBMUCOSAL RESECTION/notes 08/16/2017  . NASAL TURBINATE REDUCTION N/A 08/16/2017   Procedure: BILATERAL INFERIOR TURBINATE REDUCTION/SUBMUCOSAL RESECTION;  Surgeon: Flo Shanks, MD;  Location: [facility] OR;  Service: ENT;  Laterality: N/A;  . REPAIR ANKLE LIGAMENT Right X 2   "reconstruction"  . SHOULDER ARTHROSCOPY  W/ ACROMIAL REPAIR Bilateral     Social History   Socioeconomic History  . Marital status: Married    Spouse name: Not on file  . Number of children: 2  . Years of education: Not on file  . Highest education level: Not on file  Occupational History  . Not on file  Social Needs  . Financial resource strain: Not on file  . Food insecurity:    Worry: Not on file    Inability: Not on file  . Transportation needs:    Medical: Not on file    Non-medical: Not on file  Tobacco Use  . Smoking status: Never Smoker  . Smokeless tobacco: Never Used  Substance and Sexual Activity  . Alcohol use: Not Currently  . Drug use: No  . Sexual activity: Yes  Lifestyle  . Physical activity:    Days per week: Not on file    Minutes per session: Not on file  . Stress: Not on file  Relationships  . Social connections:    Talks on phone: Not on file    Gets together: Not on file    Attends religious service: Not on file    Active member of club or organization: Not on file    Attends meetings of clubs or organizations: Not on file    Relationship status: Not on file  . Intimate partner violence:    Fear of current or ex partner: Not on file  Emotionally abused: Not on file    Physically abused: Not on file    Forced sexual activity: Not on file  Other Topics Concern  . Not on file  Social History Narrative  . Not on file    Current Outpatient Medications on File Prior to Visit  Medication Sig Dispense Refill  . amLODipine (NORVASC) 10 MG tablet Take 1 tablet (10 mg total) by mouth daily. 90 tablet 1  . Ascorbic Acid (VITAMIN C) 1000 MG tablet Take 1,000 mg by mouth daily.    Marland Kitchen aspirin EC 81 MG tablet Take 81 mg by mouth daily.    Marland Kitchen b complex vitamins tablet Take 2 tablets by mouth daily.    Marland Kitchen buPROPion (WELLBUTRIN XL) 150 MG 24 hr tablet Take 1 tablet (150 mg total) by mouth every morning. 30 tablet 2  . cholecalciferol (VITAMIN D3) 25 MCG (1000 UT) tablet Take 1,000 Units by mouth  daily.    Marland Kitchen CINNAMON PO Take 2,000 mg by mouth daily.    . Coenzyme Q10 300 MG CAPS Take 300 mg by mouth daily.    . Glucosamine-Chondroit-Vit C-Mn (GLUCOSAMINE 1500 COMPLEX) CAPS Take 2 capsules by mouth daily.    Marland Kitchen levothyroxine (SYNTHROID, LEVOTHROID) 100 MCG tablet Take 100 mcg by mouth at bedtime.    . Linoleic Acid-Sunflower Oil (CLA PO) Take 500 mg by mouth daily.    Marland Kitchen losartan-hydrochlorothiazide (HYZAAR) 100-25 MG tablet Take 1 tablet by mouth daily.    . naproxen sodium (ALEVE) 220 MG tablet Take 440 mg by mouth daily as needed (pain).    . Omega-3 Fatty Acids (OMEGA 3 500 PO) Take 1,000 mg by mouth daily.    Marland Kitchen testosterone cypionate (DEPOTESTOSTERONE CYPIONATE) 200 MG/ML injection Inject 300 mg into the muscle every 21 ( twenty-one) days.    . vitamin E 400 UNIT capsule Take 400 Units by mouth daily.    . vitamin k 100 MCG tablet Take 100 mcg by mouth daily.     No current facility-administered medications on file prior to visit.     Review of Systems  Constitution: Negative for chills, decreased appetite, malaise/fatigue and weight gain.  Cardiovascular: Positive for dyspnea on exertion. Negative for leg swelling and syncope.  Respiratory: Positive for shortness of breath and sleep disturbances due to breathing (on CPAP and compliant).   Endocrine: Negative for cold intolerance.  Hematologic/Lymphatic: Does not bruise/bleed easily.  Musculoskeletal: Positive for arthritis, back pain and joint pain. Negative for joint swelling.  Gastrointestinal: Negative for abdominal pain, anorexia and change in bowel habit.  Genitourinary: Positive for decreased libido.  Neurological: Negative for headaches and light-headedness.  Psychiatric/Behavioral: Negative for depression and substance abuse.  All other systems reviewed and are negative.     Objective:  Blood pressure (!) 130/94, pulse 79, height 6\' 1"  (1.854 m), weight (!) 304 lb (137.9 kg), SpO2 98 %. Body mass index is 40.11  kg/m.  Physical Exam  Constitutional: He appears well-developed. No distress.  Morbidly obese  HENT:  Head: Atraumatic.  Eyes: Conjunctivae are normal.  Neck: Neck supple. No JVD present. No thyromegaly present.  Cardiovascular: Normal rate, regular rhythm, normal heart sounds and intact distal pulses. Exam reveals no gallop.  No murmur heard. Pulmonary/Chest: Effort normal and breath sounds normal.  Abdominal: Soft. Bowel sounds are normal.  Pannus present   Musculoskeletal: Normal range of motion.  Neurological: He is alert.  Skin: Skin is warm and dry.  Psychiatric: He has a normal mood and affect.  Radiology: No results found. Laboratory Examination:    CMP Latest Ref Rng & Units 08/16/2017  Glucose 65 - 99 mg/dL 161(W128(H)  BUN 6 - 20 mg/dL 96(E22(H)  Creatinine 4.540.61 - 1.24 mg/dL 0.98(J1.34(H)  Sodium 191135 - 478145 mmol/L 140  Potassium 3.5 - 5.1 mmol/L 5.0  Chloride 101 - 111 mmol/L 106  CO2 22 - 32 mmol/L 28  Calcium 8.9 - 10.3 mg/dL 2.9(F8.8(L)   CBC Latest Ref Rng & Units 08/16/2017 06/14/2007  WBC 4.0 - 10.5 K/uL 4.5 -  Hemoglobin 13.0 - 17.0 g/dL 62.114.8 30.814.6  Hematocrit 65.739.0 - 52.0 % 43.6 -  Platelets 150 - 400 K/uL 176 -    Cardiac studies:   Echocardiogram 01/10/2019: Evaluation limited due to suboptimal acoustic windows. Normal LV systolic function with EF 60%. Left ventricle cavity is normal in size. Moderate concentric hypertrophy of the left ventricle. Normal global wall motion. Doppler evidence of grade I (impaired) diastolic dysfunction, normal LAP. Calculated EF 60%. Mild (Grade I) mitral regurgitation. Small pericardial effusion.  Assessment:    Essential hypertension  Pure hypercholesterolemia  PCP EKG 12/14/2018: Probable lead misplacement, I will assume no misplacement,  Normal sinus rhythm at rate of 67 bpm, right axis deviation, RVH.  High lateral ischemia.  EKG 01/30/2019: Normal sinus rhythm at the rate of 72 bpm, normal axis, incomplete right bundle  branch block.  Otherwise normal EKG.  Recommendations:    Patient with hypertension, hyperlipidemia, hyperglycemia, morbid obesity and obstructive sleep apnea on CPAP referred to me for evaluation of difficult to control hypertension and also abnormal EKG, last seen by me 6 weeks ago. I had added amlodipine 10 mg daily which is tolerating without side effects.  Blood pressure still continues to remain elevated, on losartan HCT which he will continue the same, Will add atenolol 50 mg BID. Echocardiogram reviewed.  In view of hyperlipidemia, hyperglycemia, hypertension and morbid obesity as risk factors, Schedule for a Lexiscan Sestamibi stress test to evaluate for myocardial ischemia. Patient unable to do treadmill stress testing due to arthritis and dyspnea.   With regard to obesity, he is presently on stable dose of testosterone, no recent change, CBC and CMP have been stable per patient, (I do not have his labs), I had Rx Wellbutrin SR 24 hour formulation 150 mg daily for weight loss.  He has not had any change in weight.  Discontinue this and will try  Saxenda SQ daily. Patient is presently on testosterone supplements, hypertension and CBC need to be monitored closely.  If he does show response to [place], I could request his PCP to follow-up further. I'll see him back in the office in 6 weeks For follow-up of hypertension, obesity, dyspnea and stress test.  1st week 0.6 mg SQ daily 2nd week 1.2 mg SQ daily 3rd week 1.8 mg SQ daily 4th week 2.4 mg SQ daily 5th week 3.0 mg SQ daily   Yates DecampJay Kesean Serviss, MD, [facility] HospitalFACC 01/30/2019, 3:28 PM [facility] [place] Pager: [phone] Office: [phone] If no answer Cell [phone]

## 2019-02-01 ENCOUNTER — Other Ambulatory Visit: Payer: Self-pay | Admitting: Cardiology

## 2019-02-01 DIAGNOSIS — Z6841 Body Mass Index (BMI) 40.0 and over, adult: Secondary | ICD-10-CM

## 2019-02-01 MED ORDER — LIRAGLUTIDE -WEIGHT MANAGEMENT 18 MG/3ML ~~LOC~~ SOPN: 3.0000 mg | PEN_INJECTOR | Freq: Every day | SUBCUTANEOUS | 3 refills | Status: DC

## 2019-02-01 MED ORDER — "NEEDLE (DISP) 23G X 1"" MISC": 1.0000 "application " | Freq: Every day | 2 refills | Status: DC

## 2019-02-01 NOTE — Progress Notes (Unsigned)
Bd

## 2019-02-05 ENCOUNTER — Other Ambulatory Visit: Payer: Self-pay

## 2019-02-05 DIAGNOSIS — Z6841 Body Mass Index (BMI) 40.0 and over, adult: Secondary | ICD-10-CM

## 2019-02-05 MED ORDER — LIRAGLUTIDE -WEIGHT MANAGEMENT 18 MG/3ML ~~LOC~~ SOPN: 3.0000 mg | PEN_INJECTOR | Freq: Every day | SUBCUTANEOUS | 3 refills | Status: DC

## 2019-02-06 ENCOUNTER — Other Ambulatory Visit: Payer: Self-pay | Admitting: Cardiology

## 2019-02-06 ENCOUNTER — Other Ambulatory Visit: Payer: Self-pay

## 2019-02-06 DIAGNOSIS — I10 Essential (primary) hypertension: Secondary | ICD-10-CM

## 2019-02-06 DIAGNOSIS — Z6841 Body Mass Index (BMI) 40.0 and over, adult: Secondary | ICD-10-CM

## 2019-02-06 MED ORDER — LIRAGLUTIDE -WEIGHT MANAGEMENT 18 MG/3ML ~~LOC~~ SOPN: 3.0000 mg | PEN_INJECTOR | Freq: Every day | SUBCUTANEOUS | 3 refills | Status: DC

## 2019-02-06 MED ORDER — ATENOLOL 50 MG PO TABS: 50.0000 mg | ORAL_TABLET | Freq: Two times a day (BID) | ORAL | 3 refills | Status: DC

## 2019-02-08 ENCOUNTER — Other Ambulatory Visit: Payer: Self-pay

## 2019-02-08 DIAGNOSIS — Z6841 Body Mass Index (BMI) 40.0 and over, adult: Secondary | ICD-10-CM

## 2019-02-08 MED ORDER — "BD DISP NEEDLE 23G X 1"" MISC": 1.0000 "application " | Freq: Every day | 2 refills | Status: DC

## 2019-02-13 ENCOUNTER — Other Ambulatory Visit: Payer: Self-pay | Admitting: Urology

## 2019-02-13 NOTE — Patient Instructions (Addendum)
Darryl CopperScott M Dahmen    Your procedure is scheduled on: 02-18-2019  [facility]  Report to admitting at 120 PM   BRING CPAP MASK AND TUBING  YOU NEED TO HAVE A COVID 19 TEST ON_______ @_______ , THIS TEST MUST BE DONE BEFORE SURGERY, [facility]. ONCE YOUR COVID TEST IS COMPLETED, PLEASE BEGIN THE QUARANTINE INSTRUCTIONS AS OUTLINED IN YOUR HANDOUT.   Call this number if you have problems the morning of surgery [phone]    Remember: Do not eat food:After Midnight. CLEAR LIQUIDS FROM MIDNIGHT UNTIL 900 AM. NOTHING BY MOUTH AFTER 900 AM.   BRUSH YOUR TEETH MORNING OF SURGERY AND RINSE YOUR MOUTH OUT, NO CHEWING GUM CANDY OR MINTS.     CLEAR LIQUID DIET   Foods Allowed                                                                     Foods Excluded  Coffee and tea, regular and decaf                             liquids that you cannot  Plain Jell-O in any flavor                                             see through such as: Fruit ices (not with fruit pulp)                                     milk, soups, orange juice  Iced Popsicles                                    All solid food Carbonated beverages, regular and diet                                    Cranberry, grape and apple juices Sports drinks like Gatorade Lightly seasoned clear broth or consume(fat free) Sugar, honey syrup  Sample Menu Breakfast                                Lunch                                     Supper Cranberry juice                    Beef broth                            Chicken broth Jell-O  Grape juice                           Apple juice Coffee or tea                        Jell-O                                      Popsicle                                                Coffee or tea                        Coffee or  tea  _____________________________________________________________________  HOLD SAXEN-DA DAY OF SURGERY   Take these medicines the morning of surgery with A SIP OF WATER: ATENOLOL, AMLODIPINE (NORVASC), LEVOTHYROXINE (SYNTHROID)                                You may not have any metal on your body including hair pins and              piercings  Do not wear jewelry,  lotions, powders or perfumes, deodorant                         Men may shave face and neck.   Do not bring valuables to the hospital. [place].  Contacts, dentures or bridgework may not be worn into surgery.      Patients discharged the day of surgery will not be allowed to drive home. IF YOU ARE HAVING SURGERY AND GOING HOME THE SAME DAY, YOU MUST HAVE AN ADULT TO DRIVE YOU HOME AND BE WITH YOU FOR 24 HOURS. YOU MAY GO HOME BY TAXI OR UBER OR ORTHERWISE, BUT AN ADULT MUST ACCOMPANY YOU HOME AND STAY WITH YOU FOR 24 HOURS.  Name and phone number of your driver:  Special Instructions: N/A              Please read over the following fact sheets you were given: _____________________________________________________________________             [facility] - Preparing for Surgery Before surgery, you can play an important role.  Because skin is not sterile, your skin needs to be as free of germs as possible.  You can reduce the number of germs on your skin by washing with CHG (chlorahexidine gluconate) soap before surgery.  CHG is an antiseptic cleaner which kills germs and bonds with the skin to continue killing germs even after washing. Please DO NOT use if you have an allergy to CHG or antibacterial soaps.  If your skin becomes reddened/irritated stop using the CHG and inform your nurse when you arrive at Short Stay. Do not shave (including legs and underarms) for at least 48 hours prior to the first CHG shower.  You may shave your face/neck. Please follow these  instructions carefully:  1.  Shower with CHG Soap the  night before surgery and the  morning of Surgery.  2.  If you choose to wash your hair, wash your hair first as usual with your  normal  shampoo.  3.  After you shampoo, rinse your hair and body thoroughly to remove the  shampoo.                           4.  Use CHG as you would any other liquid soap.  You can apply chg directly  to the skin and wash                       Gently with a scrungie or clean washcloth.  5.  Apply the CHG Soap to your body ONLY FROM THE NECK DOWN.   Do not use on face/ open                           Wound or open sores. Avoid contact with eyes, ears mouth and genitals (private parts).                       Wash face,  Genitals (private parts) with your normal soap.             6.  Wash thoroughly, paying special attention to the area where your surgery  will be performed.  7.  Thoroughly rinse your body with warm water from the neck down.  8.  DO NOT shower/wash with your normal soap after using and rinsing off  the CHG Soap.                9.  Pat yourself dry with a clean towel.            10.  Wear clean pajamas.            11.  Place clean sheets on your bed the night of your first shower and do not  sleep with pets. Day of Surgery : Do not apply any lotions/deodorants the morning of surgery.  Please wear clean clothes to the hospital/surgery center.  FAILURE TO FOLLOW THESE INSTRUCTIONS MAY RESULT IN THE CANCELLATION OF YOUR SURGERY PATIENT SIGNATURE_________________________________  NURSE SIGNATURE__________________________________  ________________________________________________________________________

## 2019-02-13 NOTE — Progress Notes (Signed)
SPOKE WITH JESSICA ZANETTO PA PATIENT IS TO HOLD SAXEN-DA DAY OF SURGERY. EKG 01-30-19 Epic CALLED CONNIE MABE INDICATION FOR SURGERY TO BE CORRECTED TO RIGHT HYDROCELE ON OR PERMIT

## 2019-02-14 ENCOUNTER — Other Ambulatory Visit: Payer: Self-pay

## 2019-02-14 ENCOUNTER — Other Ambulatory Visit (HOSPITAL_COMMUNITY)
Admission: RE | Admit: 2019-02-14 | Discharge: 2019-02-14 | Disposition: A | Discharge status: Home / Self Care | Payer: BC Managed Care – PPO | Source: Ambulatory Visit | Attending: Urology | Admitting: Urology

## 2019-02-14 ENCOUNTER — Encounter (HOSPITAL_COMMUNITY): Payer: Self-pay

## 2019-02-14 ENCOUNTER — Encounter (HOSPITAL_COMMUNITY)
Admission: RE | Admit: 2019-02-14 | Discharge: 2019-02-14 | Disposition: A | Discharge status: Home / Self Care | Payer: BC Managed Care – PPO | Source: Ambulatory Visit | Attending: Urology | Admitting: Urology

## 2019-02-14 DIAGNOSIS — Z1159 Encounter for screening for other viral diseases: Secondary | ICD-10-CM | POA: Insufficient documentation

## 2019-02-14 DIAGNOSIS — Z01812 Encounter for preprocedural laboratory examination: Secondary | ICD-10-CM | POA: Insufficient documentation

## 2019-02-14 LAB — CBC
HCT: 42.3 % (ref 39.0–52.0)
Hemoglobin: 14.6 g/dL (ref 13.0–17.0)
MCH: 31.4 pg (ref 26.0–34.0)
MCHC: 34.5 g/dL (ref 30.0–36.0)
MCV: 91 fL (ref 80.0–100.0)
Platelets: 238 10*3/uL (ref 150–400)
RBC: 4.65 MIL/uL (ref 4.22–5.81)
RDW: 12.2 % (ref 11.5–15.5)
WBC: 5.8 10*3/uL (ref 4.0–10.5)
nRBC: 0 % (ref 0.0–0.2)

## 2019-02-14 LAB — BASIC METABOLIC PANEL
Anion gap: 11 (ref 5–15)
BUN: 22 mg/dL — ABNORMAL HIGH (ref 6–20)
CO2: 26 mmol/L (ref 22–32)
Calcium: 9 mg/dL (ref 8.9–10.3)
Chloride: 105 mmol/L (ref 98–111)
Creatinine, Ser: 1.2 mg/dL (ref 0.61–1.24)
GFR calc Af Amer: >60 mL/min (ref >60)
GFR calc non Af Amer: >60 mL/min (ref >60)
Glucose, Bld: 105 mg/dL — ABNORMAL HIGH (ref 70–99)
Potassium: 4.1 mmol/L (ref 3.5–5.1)
Sodium: 142 mmol/L (ref 135–145)

## 2019-02-14 LAB — SARS CORONAVIRUS 2 (TAT 6-24 HRS): SARS Coronavirus 2: NEGATIVE

## 2019-02-15 MED ORDER — LIRAGLUTIDE -WEIGHT MANAGEMENT 18 MG/3ML ~~LOC~~ SOPN
18.0000 mL | PEN_INJECTOR | Freq: Once | SUBCUTANEOUS | Status: AC
  Administered 2019-02-15: 18 mL via SUBCUTANEOUS

## 2019-02-15 NOTE — Anesthesia Preprocedure Evaluation (Addendum)
Anesthesia Evaluation  Patient identified by MRN, date of birth, ID band Patient awake    Reviewed: Allergy & Precautions, NPO status , Patient's Chart, lab work & pertinent test results, reviewed documented beta blocker date and time   Airway Mallampati: II  TM Distance: >3 FB Neck ROM: Full    Dental no notable dental hx.    Pulmonary sleep apnea and Continuous Positive Airway Pressure Ventilation ,    Pulmonary exam normal breath sounds clear to auscultation       Cardiovascular hypertension, Pt. on medications and Pt. on home beta blockers Normal cardiovascular exam Rhythm:Regular Rate:Normal  ECG: Normal sinus rhythm at the rate of 72 bpm, normal axis, incomplete right bundle branch block.   Neuro/Psych  Headaches, PSYCHIATRIC DISORDERS    GI/Hepatic negative GI ROS, Neg liver ROS,   Endo/Other  Hypothyroidism Morbid obesity  Renal/GU negative Renal ROS     Musculoskeletal negative musculoskeletal ROS (+)   Abdominal (+) + obese,   Peds  Hematology negative hematology ROS (+) HLD   Anesthesia Other Findings RIGHT HYDROCELE  Reproductive/Obstetrics                            Anesthesia Physical Anesthesia Plan  ASA: II  Anesthesia Plan: General   Post-op Pain Management:    Induction: Intravenous  PONV Risk Score and Plan: 2 and Ondansetron, Dexamethasone, Midazolam and Treatment may vary due to age or medical condition  Airway Management Planned: LMA  Additional Equipment:   Intra-op Plan:   Post-operative Plan: Extubation in OR  Informed Consent: I have reviewed the patients History and Physical, chart, labs and discussed the procedure including the risks, benefits and alternatives for the proposed anesthesia with the patient or authorized representative who has indicated his/her understanding and acceptance.     Dental advisory given  Plan Discussed with:  CRNA  Anesthesia Plan Comments: (Reviewed PAT note 02/14/2019, Konrad Felix, PA-C)       Anesthesia Quick Evaluation

## 2019-02-15 NOTE — Addendum Note (Signed)
Addended by: Georgeanna Harrison on: 02/15/2019 04:25 PM   Modules accepted: Orders

## 2019-02-15 NOTE — Progress Notes (Addendum)
Anesthesia Chart Review   Case: [id number] Date/Time: 02/18/19 1505   Procedure: HYDROCELE REPAIR (Right )   Anesthesia type: General   Pre-op diagnosis: RIGHT HYDROCELE   Location: [facility]   Surgeon: Heloise PurpuraBorden, Lester, MD      DISCUSSION: 58 yo never smoker with h/o hypothyroidism, OSA on CPAP, hyperlipemia, HTN, right hydrocele scheduled for above procedure 02/18/2019 with Dr. Heloise PurpuraLester Borden.    Pt last seen by cardiologist, Dr. Jacinto HalimGanji, on 01/30/2019 due to difficult to control BP. Atenolol added at this visit.  Echo reviewed with EF 60%, mild mitral regurgitation noted.  Stress test ordered at this visit.  Called Dr. Verl DickerGanji's office to see if this has been done and if not if needed prior to upcoming surgery.  Awaiting call back.    Per Dr. Verl DickerGanji's nurse pt can proceed with planned procedure on Monday.  VS: BP 129/75   Pulse 74   Temp 36.9 C (Oral)   Resp 16   Ht 6\' 1"  (1.854 m)   Wt 136.1 kg   SpO2 98%   BMI 39.58 kg/m   PROVIDERS: Pearson GrippeKim, James, MD is PCP   Yates DecampGanji, Jay, MD is Cardiologist LABS: Labs reviewed: Acceptable for surgery. (all labs ordered are listed, but only abnormal results are displayed)  Labs Reviewed  BASIC METABOLIC PANEL - Abnormal; Notable for the following components:      Result Value   Glucose, Bld 105 (*)    BUN 22 (*)    All other components within normal limits  CBC     IMAGES:   EKG: EKG 01/30/2019: Normal sinus rhythm at the rate of 72 bpm, normal axis, incomplete right bundle branch block. Otherwise normal EKG.  CV: Echo 01/10/2019 Echocardiogram 01/10/2019: Evaluation limited due to suboptimal acoustic windows. Normal LV systolic function with EF 60%. Left ventricle cavity is normal in size. Moderate concentric hypertrophy of the left ventricle. Normal global wall motion. Doppler evidence of grade I (impaired) diastolic dysfunction, normal LAP. Calculated EF 60%. Mild (Grade I) mitral regurgitation. Small pericardial  effusion. Past Medical History:  Diagnosis Date  . Arthritis    "hands, knees" (08/16/2017)  . Family history of adverse reaction to anesthesia    Father - younger - aggresive- no problem later   . High cholesterol   . HTN (hypertension) 01/30/2019  . Hyperlipemia 01/30/2019  . Hypothyroidism   . OSA on CPAP   . Sinus headache    "a few/year" (08/16/2017)    Past Surgical History:  Procedure Laterality Date  . ANTERIOR CRUCIATE LIGAMENT REPAIR Left   . ARTHROSCOPIC REPAIR ACL    . BILATERAL CARPAL TUNNEL RELEASE Bilateral 10/2016-03/2017   right-left  . COLONOSCOPY W/ POLYPECTOMY    . KNEE ARTHROSCOPY Left X 2   bucket tear, Menisus  . KNEE ARTHROSCOPY Right X 2  . NASAL TURBINATE REDUCTION Bilateral 08/16/2017    INFERIOR TURBINATE REDUCTION/SUBMUCOSAL RESECTION/notes 08/16/2017  . NASAL TURBINATE REDUCTION N/A 08/16/2017   Procedure: BILATERAL INFERIOR TURBINATE REDUCTION/SUBMUCOSAL RESECTION;  Surgeon: Flo ShanksWolicki, Karol, MD;  [facility];  Service: ENT;  Laterality: N/A;  . REPAIR ANKLE LIGAMENT Right X 2   "reconstruction"  . SHOULDER ARTHROSCOPY W/ ACROMIAL REPAIR Bilateral     MEDICATIONS: . amLODipine (NORVASC) 10 MG tablet  . Ascorbic Acid (VITAMIN C) 1000 MG tablet  . aspirin EC 81 MG tablet  . atenolol (TENORMIN) 50 MG tablet  . b complex vitamins tablet  . buPROPion (WELLBUTRIN XL) 150 MG 24  hr tablet  . Cholecalciferol (VITAMIN D) 125 MCG (5000 UT) CAPS  . CINNAMON PO  . Coenzyme Q10 300 MG CAPS  . Glucosamine-Chondroit-Vit C-Mn (GLUCOSAMINE 1500 COMPLEX) CAPS  . levothyroxine (SYNTHROID, LEVOTHROID) 100 MCG tablet  . Liraglutide -Weight Management (SAXENDA) 18 MG/3ML SOPN  . losartan-hydrochlorothiazide (HYZAAR) 100-25 MG tablet  . naproxen sodium (ALEVE) 220 MG tablet  . Omega-3 Fatty Acids (OMEGA 3 500 PO)  . testosterone cypionate (DEPOTESTOSTERONE CYPIONATE) 200 MG/ML injection  . vitamin E 400 UNIT capsule  . vitamin k 100 MCG tablet   No current  facility-administered medications for this encounter.     Maia Plan [facility] Pre-Surgical Testing [phone] 02/15/19 1:36 PM

## 2019-02-15 NOTE — H&P (Signed)
Office Visit Report     10/31/2018    Right scrotal swelling   Mr. Darryl [name] is a 58 year old gentleman who presents at the request of Dr. Pearson GrippeJames Kim in consultation for right scrotal swelling. He states that he 1st noted some increase in size in the right hemiscrotum approximately 4-5 years ago. He does not recall any inciting traumatic event and has no history of scrotal or testicular infections. He had noted gradual increase his in the size of the right hemiscrotum and reportedly underwent an ultrasound about 2 years ago per Dr. Selena BattenKim that did not demonstrate any concerning findings. These images are not available on [facility]. He has noted a significant increase in the size of the right hemiscrotum over the past year. He notes some heaviness but denies any significant pain. He is starting to notice that this is somewhat uncomfortable when he sits or gets in certain positions and is becoming somewhat uncomfortable when he wears certain clothes. He denies any history of GU malignancy, trauma, or surgery. He denies a history of UTIs, STDs, or urolithiasis.     ALLERGIES: None   MEDICATIONS: Levothyroxine Sodium  Cinnamon  Cla  Collagen  Coq10  Fish Oil  Glucosamine & Chondroitin  Losartan Potassium  Red Yeast Rice  Turmeric  Vitamin B Complex  Vitamin C  Vitamin D3  Vitamin E  Vitamin K  Yohimbe     GU PSH: None   NON-GU PSH: Ankle Arthroscopy/surgery, Right Knee Arthroscopy, Bilateral Shoulder Arthroscopy/surgery, Bilateral Sinus surgery Wrist Arthroscopy, Bilateral    GU PMH: None   NON-GU PMH: Arthritis Hypercholesterolemia Hypertension Hypothyroidism Sleep Apnea    FAMILY HISTORY: Congestive Heart Failure - Father Hypercholesterolemia - Mother    Notes: 2 daughters   SOCIAL HISTORY: Marital Status: Married Preferred Language: English; Ethnicity: Not Hispanic Or Latino; Race: White Current Smoking Status: Patient has never smoked.   Tobacco Use Assessment  Completed: Used Tobacco in last 30 days? Drinks 1 drink per month.  Drinks 1 caffeinated drink per day.    REVIEW OF SYSTEMS:    GU Review Male:   Patient reports get up at night to urinate and stream starts and stops. Patient denies frequent urination, hard to postpone urination, burning/ pain with urination, leakage of urine, trouble starting your streams, and have to strain to urinate .  Gastrointestinal (Lower):   Patient denies diarrhea and constipation.  Gastrointestinal (Upper):   Patient denies nausea and vomiting.  Constitutional:   Patient denies fever, night sweats, weight loss, and fatigue.  Skin:   Patient reports skin rash/ lesion and itching.   Eyes:   Patient denies blurred vision and double vision.  Ears/ Nose/ Throat:   Patient denies sore throat and sinus problems.  Hematologic/Lymphatic:   Patient denies swollen glands and easy bruising.  Cardiovascular:   Patient reports leg swelling. Patient denies chest pains.  Respiratory:   Patient denies cough and shortness of breath.  Endocrine:   Patient denies excessive thirst.  Musculoskeletal:   Patient reports joint pain. Patient denies back pain.  Neurological:   Patient denies headaches and dizziness.  Psychologic:   Patient denies depression and anxiety.   VITAL SIGNS:        Weight 300 lb / 136.08 kg  Height 73 in / 185.42 cm  BMI 39.6 kg/m     MULTI-SYSTEM PHYSICAL EXAMINATION:    Constitutional: Well-nourished. No physical deformities. Normally developed. Good grooming.  Neck: Neck symmetrical, not swollen. Normal tracheal position.  Respiratory:  No labored breathing, no use of accessory muscles. Clear bilaterally.  Cardiovascular: Normal temperature, normal extremity pulses, no swelling, no varicosities. Regular rate and rhythm.  Lymphatic: No enlargement of neck, axillae, groin.  Skin: No paleness, no jaundice, no cyanosis. No lesion, no ulcer, no rash.  Neurologic / Psychiatric: Oriented to time, oriented  to place, oriented to person. No depression, no anxiety, no agitation.  Gastrointestinal: No mass, no tenderness, no rigidity, non obese abdomen.  Eyes: Normal conjunctivae. Normal eyelids.  Ears, Nose, Mouth, and Throat: Left ear no scars, no lesions, no masses. Right ear no scars, no lesions, no masses. Nose no scars, no lesions, no masses. Normal hearing. Normal lips.  Musculoskeletal: Normal gait and station of head and neck.     PAST DATA REVIEWED:  Source Of History:  Patient  X-Ray Review: Scrotal Ultrasound: Reviewed Films.    Notes:                     I independently reviewed his scrotal ultrasound. This does demonstrate a simple appearing right-sided hydrocele measuring 8 x 6 cm. He also has a smaller but present left-sided hydrocele. The right testicle appears normal without masses.      ASSESSMENT:      ICD-10 Details  1 GU:   Hydrocele - N43.0    PLAN:             1. Right hydrocele: He is symptomatic and has elected to proceed with surgical treatment.  We reviewed the procedure in detail and that would include an outpatient surgery with a scrotal incision and hydrocele repair. We reviewed the potential risks of this procedure including bleeding, infection, damage or loss of the testicle, recurrence, and risks of general anesthesia, etc. We reviewed the expected recovery process. I discussed the potential benefits and risks of the procedure, side effects of the proposed treatment, the likelihood of the patient achieving the goals of the procedure, and any potential problems that might occur during the procedure or recuperation.

## 2019-02-17 MED ORDER — DEXTROSE 5 % IV SOLN
3.0000 g | Freq: Once | INTRAVENOUS | Status: AC
  Administered 2019-02-18: 3 g via INTRAVENOUS
  Filled 2019-02-17: qty 3

## 2019-02-18 ENCOUNTER — Encounter (HOSPITAL_COMMUNITY)
Admission: RE | Disposition: A | Discharge status: Home / Self Care | Payer: Self-pay | Source: Home / Self Care | Attending: Urology

## 2019-02-18 ENCOUNTER — Ambulatory Visit (HOSPITAL_COMMUNITY): Payer: BC Managed Care – PPO | Admitting: Registered Nurse

## 2019-02-18 ENCOUNTER — Encounter (HOSPITAL_COMMUNITY): Payer: Self-pay | Admitting: *Deleted

## 2019-02-18 ENCOUNTER — Ambulatory Visit (HOSPITAL_COMMUNITY)
Admission: RE | Admit: 2019-02-18 | Discharge: 2019-02-18 | Disposition: A | Discharge status: Home / Self Care | Payer: BC Managed Care – PPO | Attending: Urology | Admitting: Urology

## 2019-02-18 ENCOUNTER — Other Ambulatory Visit: Payer: Self-pay

## 2019-02-18 ENCOUNTER — Ambulatory Visit (HOSPITAL_COMMUNITY): Payer: BC Managed Care – PPO | Admitting: Physician Assistant

## 2019-02-18 DIAGNOSIS — Z79890 Hormone replacement therapy (postmenopausal): Secondary | ICD-10-CM | POA: Diagnosis not present

## 2019-02-18 DIAGNOSIS — Z6839 Body mass index (BMI) 39.0-39.9, adult: Secondary | ICD-10-CM | POA: Diagnosis not present

## 2019-02-18 DIAGNOSIS — G4730 Sleep apnea, unspecified: Secondary | ICD-10-CM | POA: Insufficient documentation

## 2019-02-18 DIAGNOSIS — E7800 Pure hypercholesterolemia, unspecified: Secondary | ICD-10-CM | POA: Insufficient documentation

## 2019-02-18 DIAGNOSIS — N433 Hydrocele, unspecified: Secondary | ICD-10-CM | POA: Diagnosis not present

## 2019-02-18 DIAGNOSIS — I10 Essential (primary) hypertension: Secondary | ICD-10-CM | POA: Insufficient documentation

## 2019-02-18 DIAGNOSIS — E039 Hypothyroidism, unspecified: Secondary | ICD-10-CM | POA: Diagnosis not present

## 2019-02-18 DIAGNOSIS — N5089 Other specified disorders of the male genital organs: Secondary | ICD-10-CM | POA: Diagnosis present

## 2019-02-18 HISTORY — PX: HYDROCELE EXCISION: SHX482

## 2019-02-18 SURGERY — HYDROCELECTOMY
Anesthesia: General | Laterality: Right

## 2019-02-18 MED ORDER — ONDANSETRON HCL 4 MG/2ML IJ SOLN
INTRAMUSCULAR | Status: DC | PRN
  Administered 2019-02-18: 4 mg via INTRAVENOUS

## 2019-02-18 MED ORDER — DEXAMETHASONE SODIUM PHOSPHATE 10 MG/ML IJ SOLN
INTRAMUSCULAR | Status: AC
  Filled 2019-02-18: qty 1

## 2019-02-18 MED ORDER — MIDAZOLAM HCL 5 MG/5ML IJ SOLN
INTRAMUSCULAR | Status: DC | PRN
  Administered 2019-02-18: 2 mg via INTRAVENOUS

## 2019-02-18 MED ORDER — PHENYLEPHRINE 40 MCG/ML (10ML) SYRINGE FOR IV PUSH (FOR BLOOD PRESSURE SUPPORT)
PREFILLED_SYRINGE | INTRAVENOUS | Status: DC | PRN
  Administered 2019-02-18 (×5): 80 ug via INTRAVENOUS

## 2019-02-18 MED ORDER — ONDANSETRON HCL 4 MG/2ML IJ SOLN
INTRAMUSCULAR | Status: AC
  Filled 2019-02-18: qty 2

## 2019-02-18 MED ORDER — PROPOFOL 10 MG/ML IV BOLUS
INTRAVENOUS | Status: AC
  Filled 2019-02-18: qty 20

## 2019-02-18 MED ORDER — HYDROCODONE-ACETAMINOPHEN 5-325 MG PO TABS
ORAL_TABLET | ORAL | Status: AC
  Administered 2019-02-18: 16:00:00 1 via ORAL
  Filled 2019-02-18: qty 1

## 2019-02-18 MED ORDER — BUPIVACAINE HCL (PF) 0.25 % IJ SOLN
INTRAMUSCULAR | Status: AC
  Filled 2019-02-18: qty 30

## 2019-02-18 MED ORDER — KETOROLAC TROMETHAMINE 30 MG/ML IJ SOLN
INTRAMUSCULAR | Status: AC
  Administered 2019-02-18: 16:00:00 30 mg via INTRAVENOUS
  Filled 2019-02-18: qty 1

## 2019-02-18 MED ORDER — MIDAZOLAM HCL 2 MG/2ML IJ SOLN
INTRAMUSCULAR | Status: AC
  Filled 2019-02-18: qty 2

## 2019-02-18 MED ORDER — PROPOFOL 10 MG/ML IV BOLUS
INTRAVENOUS | Status: DC | PRN
  Administered 2019-02-18: 200 mg via INTRAVENOUS

## 2019-02-18 MED ORDER — PHENYLEPHRINE 40 MCG/ML (10ML) SYRINGE FOR IV PUSH (FOR BLOOD PRESSURE SUPPORT)
PREFILLED_SYRINGE | INTRAVENOUS | Status: AC
  Filled 2019-02-18: qty 10

## 2019-02-18 MED ORDER — LIDOCAINE 2% (20 MG/ML) 5 ML SYRINGE
INTRAMUSCULAR | Status: DC | PRN
  Administered 2019-02-18: 60 mg via INTRAVENOUS

## 2019-02-18 MED ORDER — HYDROCODONE-ACETAMINOPHEN 5-325 MG PO TABS: 1.0000 | ORAL_TABLET | Freq: Four times a day (QID) | ORAL | 0 refills | Status: DC | PRN

## 2019-02-18 MED ORDER — PROMETHAZINE HCL 25 MG/ML IJ SOLN: 6.2500 mg | INTRAMUSCULAR | Status: DC | PRN

## 2019-02-18 MED ORDER — FENTANYL CITRATE (PF) 100 MCG/2ML IJ SOLN
INTRAMUSCULAR | Status: AC
  Filled 2019-02-18: qty 2

## 2019-02-18 MED ORDER — KETOROLAC TROMETHAMINE 30 MG/ML IJ SOLN
30.0000 mg | Freq: Once | INTRAMUSCULAR | Status: AC | PRN
  Administered 2019-02-18: 30 mg via INTRAVENOUS

## 2019-02-18 MED ORDER — HYDROMORPHONE HCL 1 MG/ML IJ SOLN
INTRAMUSCULAR | Status: AC
  Filled 2019-02-18: qty 1

## 2019-02-18 MED ORDER — HYDROMORPHONE HCL 1 MG/ML IJ SOLN
0.2500 mg | INTRAMUSCULAR | Status: DC | PRN
  Administered 2019-02-18 (×2): 0.5 mg via INTRAVENOUS

## 2019-02-18 MED ORDER — HYDROCODONE-ACETAMINOPHEN 5-325 MG PO TABS
1.0000 | ORAL_TABLET | Freq: Once | ORAL | Status: AC
  Administered 2019-02-18: 1 via ORAL

## 2019-02-18 MED ORDER — BUPIVACAINE HCL (PF) 0.25 % IJ SOLN
INTRAMUSCULAR | Status: DC | PRN
  Administered 2019-02-18: 10 mL

## 2019-02-18 MED ORDER — ACETAMINOPHEN 500 MG PO TABS
1000.0000 mg | ORAL_TABLET | Freq: Once | ORAL | Status: AC
  Administered 2019-02-18: 1000 mg via ORAL
  Filled 2019-02-18: qty 2

## 2019-02-18 MED ORDER — FENTANYL CITRATE (PF) 100 MCG/2ML IJ SOLN
INTRAMUSCULAR | Status: DC | PRN
  Administered 2019-02-18 (×2): 50 ug via INTRAVENOUS

## 2019-02-18 MED ORDER — 0.9 % SODIUM CHLORIDE (POUR BTL) OPTIME
TOPICAL | Status: DC | PRN
  Administered 2019-02-18: 1000 mL

## 2019-02-18 MED ORDER — DEXAMETHASONE SODIUM PHOSPHATE 10 MG/ML IJ SOLN
INTRAMUSCULAR | Status: DC | PRN
  Administered 2019-02-18: 10 mg via INTRAVENOUS

## 2019-02-18 MED ORDER — LACTATED RINGERS IV SOLN
INTRAVENOUS | Status: DC
  Administered 2019-02-18 (×2): via INTRAVENOUS

## 2019-02-18 MED ORDER — LIDOCAINE 2% (20 MG/ML) 5 ML SYRINGE
INTRAMUSCULAR | Status: AC
  Filled 2019-02-18: qty 5

## 2019-02-18 SURGICAL SUPPLY — 23 items
ADH SKN CLS APL DERMABOND .7 (GAUZE/BANDAGES/DRESSINGS) ×1
BLADE HEX COATED 2.75 (ELECTRODE) ×3 IMPLANT
BNDG GAUZE ELAST 4 BULKY (GAUZE/BANDAGES/DRESSINGS) ×3 IMPLANT
COVER SURGICAL LIGHT HANDLE (MISCELLANEOUS) ×3 IMPLANT
COVER WAND RF STERILE (DRAPES) IMPLANT
DERMABOND ADVANCED (GAUZE/BANDAGES/DRESSINGS) ×2
DERMABOND ADVANCED .7 DNX12 (GAUZE/BANDAGES/DRESSINGS) IMPLANT
DRAPE LAPAROTOMY T 98X78 PEDS (DRAPES) ×2 IMPLANT
ELECT REM PT RETURN 15FT ADLT (MISCELLANEOUS) ×3 IMPLANT
GLOVE BIOGEL M STRL SZ7.5 (GLOVE) ×3 IMPLANT
GOWN STRL REUS W/TWL LRG LVL3 (GOWN DISPOSABLE) ×5 IMPLANT
KIT BASIN OR (CUSTOM PROCEDURE TRAY) ×3 IMPLANT
KIT TURNOVER KIT A (KITS) ×2 IMPLANT
NEEDLE HYPO 22GX1.5 SAFETY (NEEDLE) ×2 IMPLANT
PACK GENERAL/GYN (CUSTOM PROCEDURE TRAY) ×3 IMPLANT
SUPPORT SCROTAL LG STRP (MISCELLANEOUS) ×2 IMPLANT
SUPPORTER ATHLETIC LG (MISCELLANEOUS) ×1
SUT CHROMIC 3 0 SH 27 (SUTURE) ×6 IMPLANT
SUT MNCRL AB 4-0 PS2 18 (SUTURE) ×2 IMPLANT
SUT VIC AB 2-0 UR5 27 (SUTURE) IMPLANT
SUT VICRYL 0 TIES 12 18 (SUTURE) IMPLANT
SYR CONTROL 10ML LL (SYRINGE) ×2 IMPLANT
TOWEL OR 17X26 10 PK STRL BLUE (TOWEL DISPOSABLE) ×4 IMPLANT

## 2019-02-18 NOTE — Progress Notes (Signed)
Clipped facial hair as per Dr Roanna Banning for better fitting of mask for surgery.

## 2019-02-18 NOTE — Transfer of Care (Signed)
Immediate Anesthesia Transfer of Care Note  Patient: Darryl [name]  Procedure(s) Performed: HYDROCELE REPAIR (Right )  Patient Location: PACU  Anesthesia Type:General  Level of Consciousness: sedated  Airway & Oxygen Therapy: Patient Spontanous Breathing and Patient connected to face mask oxygen  Post-op Assessment: Report given to RN and Post -op Vital signs reviewed and stable  Post vital signs: Reviewed and stable  Last Vitals:  Vitals Value Taken Time  BP    Temp    Pulse    Resp    SpO2      Last Pain:  Vitals:   02/18/19 1205  TempSrc:   PainSc: 0-No pain         Complications: No apparent anesthesia complications

## 2019-02-18 NOTE — Discharge Instructions (Addendum)
1.  Activity:  You should not engage in any heavy lifting (> 10-15 lbs), strenuous activity, or straining for two weeks. 2. Diet: You can resume your normal diet. 3. Prescriptions:  You will be provided a prescription for pain medication to take as needed.  If your pain is not severe enough to require the prescription pain medication, you may take extra strength Tylenol instead which will have less side effects.  You should also take a prescribed stool softener to avoid straining with bowel movements as the prescription pain medication may constipate you. 4. Incisions: You have absorbable sutures and they will not need to be removed.  There is some skin glue over the incision and no special care is necessary.  Using an ice pack to the scrotum intermittently for the first few days will be helpful.  You may shower in 48 hours.  Avoid soaking in water (avoiding swimming, hot tubs, baths, etc) until your postoperative appointment. 5. What to call us about: You should call the office [phone]) if you develop fever > 101 or uncontrolled pain.

## 2019-02-18 NOTE — Anesthesia Procedure Notes (Signed)
Procedure Name: LMA Insertion Date/Time: 02/18/2019 2:18 PM Performed by: Talbot Grumbling, CRNA Pre-anesthesia Checklist: Patient identified, Suction available, Emergency Drugs available and Patient being monitored Patient Re-evaluated:Patient Re-evaluated prior to induction Oxygen Delivery Method: Circle system utilized Preoxygenation: Pre-oxygenation with 100% oxygen Induction Type: IV induction Ventilation: Mask ventilation without difficulty LMA: LMA inserted LMA Size: 5.0 Number of attempts: 1 Placement Confirmation: positive ETCO2 and breath sounds checked- equal and bilateral Tube secured with: Tape Dental Injury: Teeth and Oropharynx as per pre-operative assessment

## 2019-02-18 NOTE — Anesthesia Postprocedure Evaluation (Signed)
Anesthesia Post Note  Patient: Darryl [name]  Procedure(s) Performed: HYDROCELE REPAIR (Right )     Patient location during evaluation: PACU Anesthesia Type: General Level of consciousness: awake and alert Pain management: pain level controlled Vital Signs Assessment: post-procedure vital signs reviewed and stable Respiratory status: spontaneous breathing, nonlabored ventilation, respiratory function stable and patient connected to nasal cannula oxygen Cardiovascular status: blood pressure returned to baseline and stable Postop Assessment: no apparent nausea or vomiting Anesthetic complications: no    Last Vitals:  Vitals:   02/18/19 1545 02/18/19 1555  BP: 100/70 112/85  Pulse: 67 62  Resp:  18  Temp:    SpO2: 93% 95%    Last Pain:  Vitals:   02/18/19 1555  TempSrc:   PainSc: 5                  Genie Mirabal P Shailynn Fong

## 2019-02-18 NOTE — Op Note (Signed)
Preoperative diagnosis: Right hydrocele  Postoperative diagnosis: Right hydrocele  Procedures: Repair of right hydrocele  Surgeon: Pryor Curia MD  Anesthesia: General  Complications: None  EBL: Minimal  Specimens: None  Indication: Mr. Sikora is a 59 year old gentleman who presented with an enlarging right-sided hydrocele that was symptomatic and causing pain and discomfort.  After reviewing options for treatment, he elected to proceed with the above surgical procedure.  The potential risks, complications, and expected recovery process were discussed in detail.  He gave informed consent to proceed.  Description of procedure: The patient was taken to the operating room and a general anesthetic was administered.  He was given preoperative antibiotics, placed in the supine position, and prepped and draped in the usual sterile fashion.  A preoperative timeout was performed.  An incision was then made in the median scrotal raphae and carried down through the dartos fascia.  The tunica vaginalis was then pushed off of the overlying dartos tissue delivering the testis into the field.  The tunica vaginalis was then carefully opened and approximately 250 cc of clear fluid was drained.  The tunica vaginalis was then opened and inverted around the contralateral side of the testis and sewed together with a running 3-0 chromic suture.  The testis was placed back into its normal anatomic position within the right hemiscrotum.  Hemostasis appeared excellent.  The dartos fascia was then closed with a running 3-0 chromic suture.  The skin was then closed with a running 4-0 Monocryl vertical mattress suture.  Dermabond was applied to the skin.  The patient tolerated the procedure well without complications.  He was able to be awakened and transferred to the recovery unit in satisfactory condition.

## 2019-02-19 ENCOUNTER — Encounter (HOSPITAL_COMMUNITY): Payer: Self-pay | Admitting: Urology

## 2019-03-11 ENCOUNTER — Ambulatory Visit: Payer: BC Managed Care – PPO | Admitting: Cardiology

## 2019-03-18 ENCOUNTER — Telehealth: Payer: Self-pay

## 2019-03-18 NOTE — Telephone Encounter (Signed)
Let him stop and see how he does. To call if BP starts to rise up > 130/80 mm hg

## 2019-03-18 NOTE — Telephone Encounter (Signed)
Pt called and said that the Atenolol is making his BP to low.  He starting cutting in half and taking BID, and BP is still low and he has no energy, lowest BP is 98/60  He wants to know if there is a lower dose or maybe can he take half once daily?

## 2019-03-19 NOTE — Telephone Encounter (Signed)
Lvm for pt saying ok to stop atenolol and monitor bp

## 2019-04-01 ENCOUNTER — Other Ambulatory Visit: Payer: Self-pay

## 2019-04-01 ENCOUNTER — Ambulatory Visit (INDEPENDENT_AMBULATORY_CARE_PROVIDER_SITE_OTHER): Payer: BC Managed Care – PPO

## 2019-04-01 DIAGNOSIS — R0609 Other forms of dyspnea: Secondary | ICD-10-CM

## 2019-04-08 ENCOUNTER — Encounter: Payer: Self-pay | Admitting: Cardiology

## 2019-04-08 ENCOUNTER — Ambulatory Visit (INDEPENDENT_AMBULATORY_CARE_PROVIDER_SITE_OTHER): Payer: BC Managed Care – PPO | Admitting: Cardiology

## 2019-04-08 ENCOUNTER — Other Ambulatory Visit: Payer: Self-pay

## 2019-04-08 VITALS — BP 121/67 | HR 79 | Ht 73.0 in | Wt 313.0 lb

## 2019-04-08 DIAGNOSIS — I10 Essential (primary) hypertension: Secondary | ICD-10-CM | POA: Diagnosis not present

## 2019-04-08 DIAGNOSIS — R0609 Other forms of dyspnea: Secondary | ICD-10-CM

## 2019-04-08 DIAGNOSIS — E7800 Pure hypercholesterolemia, unspecified: Secondary | ICD-10-CM

## 2019-04-08 DIAGNOSIS — Z6841 Body Mass Index (BMI) 40.0 and over, adult: Secondary | ICD-10-CM

## 2019-04-08 MED ORDER — CONTRAVE 8-90 MG PO TB12: 2.0000 | ORAL_TABLET | Freq: Two times a day (BID) | ORAL | 3 refills | Status: DC

## 2019-04-08 NOTE — Progress Notes (Signed)
Primary Physician/Referring:  Pearson GrippeKim, James, MD  Patient ID: Darryl [name] [name], male    DOB: [birth year], 58 y.o.   MRN: [id number]  Chief Complaint  Patient presents with   Hypertension   Hyperlipidemia   Follow-up    HPI: Darryl [name] [place]  is a 58 y.o. male  with Patient with hypertension, hyperlipidemia, hyperglycemia, morbid obesity and obstructive sleep apnea on CPAP referred to me for evaluation of difficult to control hypertension and also abnormal EKG.  Patient with chronic degenerative joint disease morbid obesity, has been having chronic shortness of breath and recently has had difficulty in controlling his hypertension.  He denies any PND or orthopnea.  He no chest pain or palpitations. No new symptoms since last OV, compliant with CPAP.   His nuclear stress test was negative on 04/01/2019.  I also started him on Saxenda for weight loss, patient states that although his appetite is reduced, he has not had any locking losing weight.  He would like to try Contrave.  Past Medical History:  Diagnosis Date   Arthritis    "hands, knees" (08/16/2017)   Family history of adverse reaction to anesthesia    Father - younger - aggresive- no problem later    High cholesterol    HTN (hypertension) 01/30/2019   Hyperlipemia 01/30/2019   Hypothyroidism    OSA on CPAP    Sinus headache    "a few/year" (08/16/2017)    Past Surgical History:  Procedure Laterality Date   ANTERIOR CRUCIATE LIGAMENT REPAIR Left    ARTHROSCOPIC REPAIR ACL     BILATERAL CARPAL TUNNEL RELEASE Bilateral 10/2016-03/2017   right-left   COLONOSCOPY W/ POLYPECTOMY     HYDROCELE EXCISION Right 02/18/2019   Procedure: HYDROCELE REPAIR;  Surgeon: Heloise PurpuraBorden, Lester, MD;  Location: [facility];  Service: Urology;  Laterality: Right;   KNEE ARTHROSCOPY Left X 2   bucket tear, Menisus   KNEE ARTHROSCOPY Right X 2   NASAL TURBINATE REDUCTION Bilateral 08/16/2017    INFERIOR TURBINATE REDUCTION/SUBMUCOSAL RESECTION/notes  08/16/2017   NASAL TURBINATE REDUCTION N/A 08/16/2017   Procedure: BILATERAL INFERIOR TURBINATE REDUCTION/SUBMUCOSAL RESECTION;  Surgeon: Flo ShanksWolicki, Karol, MD;  Location: [facility];  Service: ENT;  Laterality: N/A;   REPAIR ANKLE LIGAMENT Right X 2   "reconstruction"   SHOULDER ARTHROSCOPY W/ ACROMIAL REPAIR Bilateral     Social History   Socioeconomic History   Marital status: Married    Spouse name: Not on file   Number of children: 2   Years of education: Not on file   Highest education level: Not on file  Occupational History   Not on file  Social Needs   Financial resource strain: Not on file   Food insecurity    Worry: Not on file    Inability: Not on file   Transportation needs    Medical: Not on file    Non-medical: Not on file  Tobacco Use   Smoking status: Never Smoker   Smokeless tobacco: Never Used  Substance and Sexual Activity   Alcohol use: Not Currently   Drug use: No   Sexual activity: Yes  Lifestyle   Physical activity    Days per week: Not on file    Minutes per session: Not on file   Stress: Not on file  Relationships   Social connections    Talks on phone: Not on file    Gets together: Not on file    Attends religious service: Not on file    Active member  of club or organization: Not on file    Attends meetings of clubs or organizations: Not on file    Relationship status: Not on file   Intimate partner violence    Fear of current or ex partner: Not on file    Emotionally abused: Not on file    Physically abused: Not on file    Forced sexual activity: Not on file  Other Topics Concern   Not on file  Social History Narrative   Not on file    Current Outpatient Medications on File Prior to Visit  Medication Sig Dispense Refill   amLODipine (NORVASC) 10 MG tablet Take 1 tablet (10 mg total) by mouth daily. 90 tablet 1   Ascorbic Acid (VITAMIN C) 1000 MG tablet Take 1,000 mg by mouth daily.     atenolol (TENORMIN) 50 MG  tablet Take 1 tablet (50 mg total) by mouth 2 (two) times daily. (Patient taking differently: Take 25 mg by mouth daily. 1/2 tab at night) 60 tablet 3   b complex vitamins tablet Take 1 tablet by mouth daily.      Cholecalciferol (VITAMIN D) 125 MCG (5000 UT) CAPS Take 5,000 Units by mouth daily.      levothyroxine (SYNTHROID, LEVOTHROID) 100 MCG tablet Take 100 mcg by mouth daily before breakfast.      Liraglutide -Weight Management (SAXENDA) 18 MG/3ML SOPN Inject 3 mg into the skin daily. Start as directed (Patient taking differently: Inject 1.2 mg into the skin daily. Start as directed) 5 pen 3   losartan-hydrochlorothiazide (HYZAAR) 100-25 MG tablet Take 1 tablet by mouth daily.     naproxen sodium (ALEVE) 220 MG tablet Take 440 mg by mouth daily as needed (pain).     Omega-3 Fatty Acids (OMEGA 3 500 PO) Take 1,000 mg by mouth daily.     testosterone cypionate (DEPOTESTOSTERONE CYPIONATE) 200 MG/ML injection Inject 300 mg into the muscle every 21 ( twenty-one) days.     vitamin E 400 UNIT capsule Take 400 Units by mouth daily.     vitamin k 100 MCG tablet Take 100 mcg by mouth daily.     No current facility-administered medications on file prior to visit.     Review of Systems  Constitution: Negative for chills, decreased appetite, malaise/fatigue and weight gain.  Cardiovascular: Positive for dyspnea on exertion. Negative for leg swelling and syncope.  Respiratory: Positive for shortness of breath and sleep disturbances due to breathing (on CPAP and compliant).   Endocrine: Negative for cold intolerance.  Hematologic/Lymphatic: Does not bruise/bleed easily.  Musculoskeletal: Positive for arthritis, back pain and joint pain. Negative for joint swelling.  Gastrointestinal: Negative for abdominal pain, anorexia and change in bowel habit.  Genitourinary: Positive for decreased libido.  Neurological: Negative for headaches and light-headedness.  Psychiatric/Behavioral: Negative for  depression and substance abuse.  All other systems reviewed and are negative.     Objective:  Blood pressure 121/67, pulse 79, height 6\' 1"  (1.854 [name]), weight (!) 313 lb (142 kg), SpO2 99 %. Body mass index is 41.3 kg/[name].  Physical Exam  Constitutional: He appears well-developed. No distress.  Morbidly obese  HENT:  Head: Atraumatic.  Eyes: Conjunctivae are normal.  Neck: Neck supple. No JVD present. No thyromegaly present.  Cardiovascular: Normal rate, regular rhythm, normal heart sounds and intact distal pulses. Exam reveals no gallop.  No murmur heard. Pulmonary/Chest: Effort normal and breath sounds normal.  Abdominal: Soft. Bowel sounds are normal.  Pannus present   Musculoskeletal: Normal  range of motion.  Neurological: He is alert.  Skin: Skin is warm and dry.  Psychiatric: He has a normal mood and affect.   Radiology: No results found. Laboratory Examination:    CMP Latest Ref Rng & Units 02/14/2019 08/16/2017  Glucose 70 - 99 mg/dL 105(H) 128(H)  BUN 6 - 20 mg/dL 22(H) 22(H)  Creatinine 0.61 - 1.24 mg/dL 1.20 1.34(H)  Sodium 135 - 145 mmol/L 142 140  Potassium 3.5 - 5.1 mmol/L 4.1 5.0  Chloride 98 - 111 mmol/L 105 106  CO2 22 - 32 mmol/L 26 28  Calcium 8.9 - 10.3 mg/dL 9.0 8.8(L)   CBC Latest Ref Rng & Units 02/14/2019 08/16/2017 06/14/2007  WBC 4.0 - 10.5 K/uL 5.8 4.5 -  Hemoglobin 13.0 - 17.0 g/dL 14.6 14.8 14.6  Hematocrit 39.0 - 52.0 % 42.3 43.6 -  Platelets 150 - 400 K/uL 238 176 -    Cardiac studies:   Echocardiogram 01/10/2019: Evaluation limited due to suboptimal acoustic windows. Normal LV systolic function with EF 60%. Left ventricle cavity is normal in size. Moderate concentric hypertrophy of the left ventricle. Normal global wall motion. Doppler evidence of grade I (impaired) diastolic dysfunction, normal LAP. Calculated EF 60%. Mild (Grade I) mitral regurgitation. Small pericardial effusion.  Lexiscan Sestamibi stress test 04/01/2019: Lexiscan  stress test was performed. Stress EKG is non-diagnostic, as this is pharmacological stress test. Myocardial perfusion imaging is normal. LVEF 57%. Low risk study.  Assessment:      ICD-10-CM   1. Dyspnea on exertion  R06.09   2. Essential hypertension  I10   3. Hypercholesteremia  E78.00   4. Class 3 severe obesity due to excess calories without serious comorbidity with body mass index (BMI) of 40.0 to 44.9 in adult (HCC)  E66.01 Naltrexone-buPROPion HCl ER (CONTRAVE) 8-90 MG TB12   Z68.41     PCP EKG 12/14/2018: Probable lead misplacement, I will assume no misplacement,  Normal sinus rhythm at rate of 67 bpm, right axis deviation, RVH.  High lateral ischemia.  EKG 01/30/2019: Normal sinus rhythm at the rate of 72 bpm, normal axis, incomplete right bundle branch block.  Otherwise normal EKG.  Recommendations:    Patient with hypertension, hyperlipidemia, hyperglycemia, morbid obesity and obstructive sleep apnea on CPAP referred to me for evaluation of difficult to control hypertension and also abnormal EKG, last seen by me 6 weeks ago. With additional amlodipine, lisinopril HCT and atenolol, his blood pressure is well controlled.  With regard to obesity, Saxenda did not improve his weight loss and he would like to try Contrave.  Rx given and instruction on titration given in writing.   I will see him back in 6-8 weeks for f/u hypertension and obesity management. If unsuccessful, needs bariatric surgical referral.   CONTRAVE:  1st week 0.6 mg SQ daily 2nd week 1.2 mg SQ daily 3rd week 1.8 mg SQ daily 4th week 2.4 mg SQ daily 5th week 3.0 mg SQ daily   Adrian Prows, MD, [facility] 04/08/2019, 2:46 PM [facility] [place] Pager: [phone] Office: [phone] If no answer Cell [phone]

## 2019-04-08 NOTE — Patient Instructions (Signed)
Contrave prescription sent.  Week 1: 1 pill in the morning Week 2: 1 pill in the morning, 1 in the evening Week 3: 2 pills in the morning, 1 in the evening Week 4: 2 pills in the morning, 2 in the evening

## 2019-05-23 ENCOUNTER — Other Ambulatory Visit: Payer: Self-pay | Admitting: Urology

## 2019-05-24 ENCOUNTER — Other Ambulatory Visit: Payer: Self-pay | Admitting: Urology

## 2019-06-13 ENCOUNTER — Other Ambulatory Visit: Payer: Self-pay | Admitting: Cardiology

## 2019-06-13 DIAGNOSIS — I10 Essential (primary) hypertension: Secondary | ICD-10-CM

## 2019-06-26 ENCOUNTER — Encounter: Payer: Self-pay | Admitting: Cardiology

## 2019-06-26 ENCOUNTER — Telehealth: Payer: Self-pay

## 2019-06-26 ENCOUNTER — Other Ambulatory Visit: Payer: Self-pay

## 2019-06-26 NOTE — Telephone Encounter (Signed)
error 

## 2019-06-27 ENCOUNTER — Ambulatory Visit (INDEPENDENT_AMBULATORY_CARE_PROVIDER_SITE_OTHER): Payer: BC Managed Care – PPO | Admitting: Cardiology

## 2019-06-27 ENCOUNTER — Encounter: Payer: Self-pay | Admitting: Cardiology

## 2019-06-27 ENCOUNTER — Other Ambulatory Visit: Payer: Self-pay

## 2019-06-27 VITALS — BP 120/78 | HR 67 | Temp 96.0°F | Ht 73.0 in | Wt 310.8 lb

## 2019-06-27 DIAGNOSIS — Z01810 Encounter for preprocedural cardiovascular examination: Secondary | ICD-10-CM | POA: Diagnosis not present

## 2019-06-27 DIAGNOSIS — R0609 Other forms of dyspnea: Secondary | ICD-10-CM

## 2019-06-27 DIAGNOSIS — I10 Essential (primary) hypertension: Secondary | ICD-10-CM

## 2019-06-27 DIAGNOSIS — Z6841 Body Mass Index (BMI) 40.0 and over, adult: Secondary | ICD-10-CM

## 2019-06-27 DIAGNOSIS — R0600 Dyspnea, unspecified: Secondary | ICD-10-CM

## 2019-06-27 MED ORDER — AMLODIPINE BESYLATE 10 MG PO TABS: 10.0000 mg | ORAL_TABLET | Freq: Every day | ORAL | 3 refills | Status: DC

## 2019-06-27 MED ORDER — LOSARTAN POTASSIUM-HCTZ 100-25 MG PO TABS: 1.0000 | ORAL_TABLET | Freq: Every day | ORAL | 3 refills | Status: DC

## 2019-06-27 MED ORDER — ATENOLOL 50 MG PO TABS: 25.0000 mg | ORAL_TABLET | Freq: Two times a day (BID) | ORAL | 3 refills | Status: DC

## 2019-06-27 NOTE — Progress Notes (Signed)
Primary Physician/Referring:  Jani Gravel, MD  Patient ID: Darryl [name], male    DOB: December 19, 1960, 58 y.o.   MRN: [id number]  Chief Complaint  Patient presents with  . Obesity    weight loss  . Hypertension  . Medical Clearance  . Follow-up    2 month    HPI: ANTAR MILKS  is a 58 y.o. male  with Patient with hypertension, hyperlipidemia, hyperglycemia, morbid obesity and obstructive sleep apnea on CPAP presents for f/u of hypertension and also abnormal EKG. He no chest pain or palpitations. No new symptoms since last OV, compliant with CPAP.   His nuclear stress test was negative on 04/01/2019. He tried saxenda and also Wellbutrin without success for weight loss and was prescribed Contrave, but awaiting PA from insurance.  Essentially no change in weight, no worsening dyspnea. Ha hydrocele and needs excision.  Past Medical History:  Diagnosis Date  . Arthritis    "hands, knees" (08/16/2017)  . Family history of adverse reaction to anesthesia    Father - younger - aggresive- no problem later   . High cholesterol   . HTN (hypertension) 01/30/2019  . Hyperlipemia 01/30/2019  . Hypothyroidism   . OSA on CPAP   . Sinus headache    "a few/year" (08/16/2017)    Past Surgical History:  Procedure Laterality Date  . ANTERIOR CRUCIATE LIGAMENT REPAIR Left   . ARTHROSCOPIC REPAIR ACL    . BILATERAL CARPAL TUNNEL RELEASE Bilateral 10/2016-03/2017   right-left  . COLONOSCOPY W/ POLYPECTOMY    . HYDROCELE EXCISION Right 02/18/2019   Procedure: HYDROCELE REPAIR;  Surgeon: Raynelle Bring, MD;  Location: [facility];  Service: Urology;  Laterality: Right;  . KNEE ARTHROSCOPY Left X 2   bucket tear, Menisus  . KNEE ARTHROSCOPY Right X 2  . NASAL TURBINATE REDUCTION Bilateral 08/16/2017    INFERIOR TURBINATE REDUCTION/SUBMUCOSAL RESECTION/notes 08/16/2017  . NASAL TURBINATE REDUCTION N/A 08/16/2017   Procedure: BILATERAL INFERIOR TURBINATE REDUCTION/SUBMUCOSAL RESECTION;  Surgeon: Jodi Marble,  MD;  Location: [facility];  Service: ENT;  Laterality: N/A;  . REPAIR ANKLE LIGAMENT Right X 2   "reconstruction"  . SHOULDER ARTHROSCOPY W/ ACROMIAL REPAIR Bilateral     Social History   Socioeconomic History  . Marital status: Married    Spouse name: Not on file  . Number of children: 2  . Years of education: Not on file  . Highest education level: Not on file  Occupational History  . Not on file  Social Needs  . Financial resource strain: Not on file  . Food insecurity    Worry: Not on file    Inability: Not on file  . Transportation needs    Medical: Not on file    Non-medical: Not on file  Tobacco Use  . Smoking status: Never Smoker  . Smokeless tobacco: Never Used  Substance and Sexual Activity  . Alcohol use: Not Currently  . Drug use: No  . Sexual activity: Yes  Lifestyle  . Physical activity    Days per week: Not on file    Minutes per session: Not on file  . Stress: Not on file  Relationships  . Social Herbalist on phone: Not on file    Gets together: Not on file    Attends religious service: Not on file    Active member of club or organization: Not on file    Attends meetings of clubs or organizations: Not on file    Relationship  status: Not on file  . Intimate partner violence    Fear of current or ex partner: Not on file    Emotionally abused: Not on file    Physically abused: Not on file    Forced sexual activity: Not on file  Other Topics Concern  . Not on file  Social History Narrative  . Not on file    Current Outpatient Medications on File Prior to Visit  Medication Sig Dispense Refill  . ARMOUR THYROID 60 MG tablet Take 1 tablet by mouth every morning.    . Ascorbic Acid (VITAMIN C) 1000 MG tablet Take 1,000 mg by mouth daily.    Marland Kitchen b complex vitamins tablet Take 1 tablet by mouth daily.     . Cholecalciferol (VITAMIN D) 125 MCG (5000 UT) CAPS Take 5,000 Units by mouth daily.     . naproxen sodium (ALEVE) 220 MG tablet Take 440 mg  by mouth daily as needed (pain).    . Omega-3 Fatty Acids (OMEGA 3 500 PO) Take 1,000 mg by mouth daily.    Marland Kitchen testosterone cypionate (DEPOTESTOSTERONE CYPIONATE) 200 MG/ML injection Inject 300 mg into the muscle every 21 ( twenty-one) days.    Marland Kitchen VITAMIN A PO Take 7,500 mcg by mouth every other day.    . vitamin E 400 UNIT capsule Take 400 Units by mouth daily.    . vitamin k 100 MCG tablet Take 100 mcg by mouth daily.     No current facility-administered medications on file prior to visit.     Review of Systems  Constitution: Negative for chills, decreased appetite, malaise/fatigue and weight gain.  Cardiovascular: Positive for dyspnea on exertion. Negative for leg swelling and syncope.  Respiratory: Positive for shortness of breath and sleep disturbances due to breathing (on CPAP and compliant).   Endocrine: Negative for cold intolerance.  Hematologic/Lymphatic: Does not bruise/bleed easily.  Musculoskeletal: Positive for arthritis, back pain and joint pain. Negative for joint swelling.  Gastrointestinal: Negative for abdominal pain, anorexia and change in bowel habit.  Genitourinary: Positive for decreased libido.  Neurological: Negative for headaches and light-headedness.  Psychiatric/Behavioral: Negative for depression and substance abuse.  All other systems reviewed and are negative.  Objective:   Vitals with BMI 06/27/2019 04/08/2019 02/18/2019  Height 6\' 1"  6\' 1"  -  Weight 310 lbs 13 oz 313 lbs -  BMI 41.01 41.3 -  Systolic 120 121  Diastolic 78 67 85  Pulse 67 79 62    Physical Exam  Constitutional: He appears well-developed. No distress.  Morbidly obese  HENT:  Head: Atraumatic.  Eyes: Conjunctivae are normal.  Neck: Neck supple. No JVD present. No thyromegaly present.  Cardiovascular: Normal rate, regular rhythm, normal heart sounds and intact distal pulses. Exam reveals no gallop.  No murmur heard. Pulmonary/Chest: Effort normal and breath sounds normal.   Abdominal: Soft. Bowel sounds are normal.  Pannus present   Musculoskeletal: Normal range of motion.  Neurological: He is alert.  Skin: Skin is warm and dry.  Psychiatric: He has a normal mood and affect.   Radiology: No results found. Laboratory Examination:    CMP Latest Ref Rng & Units 02/14/2019 08/16/2017  Glucose 70 - 99 mg/dL 02/16/2019) 08/18/2017)  BUN 6 - 20 mg/dL 166(M) 600(K)  Creatinine 0.61 - 1.24 mg/dL 59(X 77(S)  Sodium 1.42 - 145 mmol/L 142 140  Potassium 3.5 - 5.1 mmol/L 4.1 5.0  Chloride 98 - 111 mmol/L 105 106  CO2 22 - 32 mmol/L 26  28  Calcium 8.9 - 10.3 mg/dL 9.0 4.0(J8.8(L)   CBC Latest Ref Rng & Units 02/14/2019 08/16/2017 06/14/2007  WBC 4.0 - 10.5 K/uL 5.8 4.5 -  Hemoglobin 13.0 - 17.0 g/dL 81.114.6 91.414.8 78.214.6  Hematocrit 39.0 - 52.0 % 42.3 43.6 -  Platelets 150 - 400 K/uL 238 176 -    Cardiac studies:   Echocardiogram 01/10/2019: Evaluation limited due to suboptimal acoustic windows. Normal LV systolic function with EF 60%. Left ventricle cavity is normal in size. Moderate concentric hypertrophy of the left ventricle. Normal global wall motion. Doppler evidence of grade I (impaired) diastolic dysfunction, normal LAP. Calculated EF 60%. Mild (Grade I) mitral regurgitation. Small pericardial effusion.  Lexiscan Sestamibi stress test 04/01/2019: Lexiscan stress test was performed. Stress EKG is non-diagnostic, as this is pharmacological stress test. Myocardial perfusion imaging is normal. LVEF 57%. Low risk study.  Assessment:      ICD-10-CM   1. Essential hypertension  I10 EKG 12-Lead    amLODipine (NORVASC) 10 MG tablet    atenolol (TENORMIN) 50 MG tablet    losartan-hydrochlorothiazide (HYZAAR) 100-25 MG tablet  2. Dyspnea on exertion  R06.00   3. Class 3 severe obesity due to excess calories without serious comorbidity with body mass index (BMI) of 40.0 to 44.9 in adult (HCC)  E66.01    Z68.41   4. Preoperative cardiovascular examination  Z01.810     EKG  06/27/2019: Normal sinus rhythm at the rate of 72 bpm, normal axis.  Poor R-wave progression, probably normal variant. IRBBB. Cannot exclude anteroseptal infarct old.  PVCs (2).  EKG 01/30/2019: Normal sinus rhythm at the rate of 72 bpm, normal axis, incomplete right bundle branch block.  Otherwise normal EKG.  Recommendations:    Patient with hypertension, hyperlipidemia, hyperglycemia, morbid obesity and obstructive sleep apnea on CPAP presents for f/u of difficult to control hypertension. With the present regiment, BP is well controlled.  I have refilled his Rx. I will see him back in 1 year.   With obesity, I had Rx him Contrave and will try to get PA on the same. Encouraged him to continue to try to loose weight. Dyspnea is related to obesity hypoventilation.   He is scheduled for hydrocele excision, low risk surgery in a low risk stress test patient. Okay to proceed.   Yates DecampJay Linea Calles, MD, [facility] 06/27/2019, 8:18 PM [facility] [place] Pager: [phone] Office: [phone] If no answer Cell [phone]  CC: Dr. Heloise PurpuraLester Borden.

## 2019-07-03 ENCOUNTER — Other Ambulatory Visit: Payer: Self-pay

## 2019-07-03 MED ORDER — NALTREXONE-BUPROPION HCL ER 8-90 MG PO TB12: ORAL_TABLET | ORAL | 11 refills | Status: DC

## 2019-07-03 MED ORDER — NALTREXONE-BUPROPION HCL ER 8-90 MG PO TB12: 1.0000 | ORAL_TABLET | ORAL | 11 refills | Status: DC

## 2019-07-18 ENCOUNTER — Other Ambulatory Visit (HOSPITAL_COMMUNITY)
Admission: RE | Admit: 2019-07-18 | Discharge: 2019-07-18 | Disposition: A | Discharge status: Home / Self Care | Payer: BC Managed Care – PPO | Source: Ambulatory Visit | Attending: Urology | Admitting: Urology

## 2019-07-18 DIAGNOSIS — Z01812 Encounter for preprocedural laboratory examination: Secondary | ICD-10-CM | POA: Diagnosis not present

## 2019-07-18 DIAGNOSIS — Z20828 Contact with and (suspected) exposure to other viral communicable diseases: Secondary | ICD-10-CM | POA: Insufficient documentation

## 2019-07-18 NOTE — Patient Instructions (Addendum)
DUE TO COVID-19 ONLY ONE VISITOR IS ALLOWED TO COME WITH YOU AND STAY IN THE WAITING ROOM ONLY DURING PRE OP AND PROCEDURE DAY OF SURGERY. THE 1 VISITOR MAY VISIT WITH YOU AFTER SURGERY IN YOUR PRIVATE ROOM DURING VISITING HOURS ONLY! ) ONCE YOUR COVID TEST IS COMPLETED, PLEASE BEGIN THE QUARANTINE INSTRUCTIONS AS OUTLINED IN YOUR HANDOUT.                [place]   Your procedure is scheduled on: 07/22/19   Report to [facility] Main  Entrance   Report to Short stay at 5:30  AM     Call this number if you have problems the morning of surgery [phone]    Remember: Do not eat food or drink liquids after Midnight.   BRUSH YOUR TEETH MORNING OF SURGERY AND RINSE YOUR MOUTH OUT, NO CHEWING GUM CANDY OR MINTS.     Take these medicines the morning of surgery with A SIP OF WATER: Atenolol, thyroid        , Bring your mask and tubing to the hospital                                 You may not have any metal on your body including piercings             Do not wear jewelry, lotions, powders or  deodorant              Men may shave face and neck.   Do not bring valuables to the hospital. [place].  Contacts, dentures or bridgework may not be worn into surgery.       Patients discharged the day of surgery will not be allowed to drive home.  IF YOU ARE HAVING SURGERY AND GOING HOME THE SAME DAY, YOU MUST HAVE AN ADULT TO DRIVE YOU HOME AND BE WITH YOU FOR 24 HOURS.  YOU MAY GO HOME BY TAXI OR UBER OR ORTHERWISE, BUT AN ADULT MUST ACCOMPANY YOU HOME AND STAY WITH YOU FOR 24 HOURS.  Name and phone number of your driver:  Special Instructions: N/A              Please read over the following fact sheets you were given: _____________________________________________________________________             [facility] - Preparing for Surgery  Before surgery, you can play an important role.   Because skin is not sterile,  your skin needs to be as free of germs as possible.   You can reduce the number of germs on your skin by washing with CHG (chlorahexidine gluconate) soap before surgery.   CHG is an antiseptic cleaner which kills germs and bonds with the skin to continue killing germs even after washing. Please DO NOT use if you have an allergy to CHG or antibacterial soaps.   If your skin becomes reddened/irritated stop using the CHG and inform your nurse when you arrive at Short Stay. .  You may shave your face/neck.  Please follow these instructions carefully:  1.  Shower with CHG Soap the night before surgery and the  morning of Surgery.  2.  If you choose to wash your hair, wash your hair first as usual with your  normal  shampoo.  3.  After you shampoo, rinse your hair and body thoroughly to remove the  shampoo.                                        4.  Use CHG as you would any other liquid soap.  You can apply chg directly  to the skin and wash                       Gently with a scrungie or clean washcloth.  5.  Apply the CHG Soap to your body ONLY FROM THE NECK DOWN.   Do not use on face/ open                           Wound or open sores. Avoid contact with eyes, ears mouth and genitals (private parts).                       Wash face,  Genitals (private parts) with your normal soap.             6.  Wash thoroughly, paying special attention to the area where your surgery  will be performed.  7.  Thoroughly rinse your body with warm water from the neck down.  8.  DO NOT shower/wash with your normal soap after using and rinsing off  the CHG Soap.             9.  Pat yourself dry with a clean towel.            10.  Wear clean pajamas.            11.  Place clean sheets on your bed the night of your first shower and do not  sleep with pets. Day of Surgery : Do not apply any lotions/deodorants the morning of surgery.  Please wear clean clothes to the hospital/surgery center.  FAILURE TO FOLLOW THESE  INSTRUCTIONS MAY RESULT IN THE CANCELLATION OF YOUR SURGERY PATIENT SIGNATURE_________________________________  NURSE SIGNATURE__________________________________  ________________________________________________________________________

## 2019-07-19 ENCOUNTER — Other Ambulatory Visit: Payer: Self-pay

## 2019-07-19 ENCOUNTER — Encounter (HOSPITAL_COMMUNITY): Payer: Self-pay

## 2019-07-19 ENCOUNTER — Encounter (HOSPITAL_COMMUNITY)
Admission: RE | Admit: 2019-07-19 | Discharge: 2019-07-19 | Disposition: A | Discharge status: Home / Self Care | Payer: BC Managed Care – PPO | Source: Ambulatory Visit | Attending: Urology | Admitting: Urology

## 2019-07-19 DIAGNOSIS — N433 Hydrocele, unspecified: Secondary | ICD-10-CM | POA: Diagnosis not present

## 2019-07-19 DIAGNOSIS — Z01812 Encounter for preprocedural laboratory examination: Secondary | ICD-10-CM | POA: Insufficient documentation

## 2019-07-19 LAB — CBC
HCT: 54.9 % — ABNORMAL HIGH (ref 39.0–52.0)
Hemoglobin: 17.9 g/dL — ABNORMAL HIGH (ref 13.0–17.0)
MCH: 29.8 pg (ref 26.0–34.0)
MCHC: 32.6 g/dL (ref 30.0–36.0)
MCV: 91.5 fL (ref 80.0–100.0)
Platelets: 217 10*3/uL (ref 150–400)
RBC: 6 MIL/uL — ABNORMAL HIGH (ref 4.22–5.81)
RDW: 12.6 % (ref 11.5–15.5)
WBC: 6 10*3/uL (ref 4.0–10.5)
nRBC: 0 % (ref 0.0–0.2)

## 2019-07-19 LAB — BASIC METABOLIC PANEL
Anion gap: 7 (ref 5–15)
BUN: 19 mg/dL (ref 6–20)
CO2: 27 mmol/L (ref 22–32)
Calcium: 9 mg/dL (ref 8.9–10.3)
Chloride: 105 mmol/L (ref 98–111)
Creatinine, Ser: 1.3 mg/dL — ABNORMAL HIGH (ref 0.61–1.24)
GFR calc Af Amer: >60 mL/min (ref >60)
GFR calc non Af Amer: >60 mL/min (ref >60)
Glucose, Bld: 124 mg/dL — ABNORMAL HIGH (ref 70–99)
Potassium: 5.1 mmol/L (ref 3.5–5.1)
Sodium: 139 mmol/L (ref 135–145)

## 2019-07-19 LAB — NOVEL CORONAVIRUS, NAA (HOSP ORDER, SEND-OUT TO REF LAB; TAT 18-24 HRS): SARS-CoV-2, NAA: NOT DETECTED

## 2019-07-19 NOTE — Progress Notes (Addendum)
PCP - Dr. Maudie Mercury Cardiologist - Dr. Bernadette Hoit  Chest x-ray - no EKG - 06/27/19 Stress Test - 04/01/19 ECHO - 01/10/19 Cardiac Cath - no  Sleep Study - yes CPAP - yes  Fasting Blood Sugar - NA Checks Blood Sugar _____ times a day  Blood Thinner Instructions:ASA Aspirin Instructions:5 days prior dr Lynne Logan office Last Dose:07/08/19  Anesthesia review:   Patient denies shortness of breath, fever, cough and chest pain at PAT appointment  yes Patient verbalized understanding of instructions that were given to them at the PAT appointment. Patient was also instructed that they will need to review over the PAT instructions again at home before surgery. yes

## 2019-07-19 NOTE — H&P (Signed)
Right hydrocele   Darryl [name] is a 58 y/o who underwent a right hydrocele repair this summer.  He unfortunately developed a recurrence.  He then underwent aspiration with subsequent recurrence.  He returns today for formal repair of his right hydrocele.    ALLERGIES: None   MEDICATIONS: Levothyroxine Sodium  Cinnamon  Cla  Collagen  Coq10  Fish Oil  Glucosamine & Chondroitin  Losartan Potassium  Red Yeast Rice  Turmeric  Vitamin B Complex  Vitamin C  Vitamin D3  Vitamin E  Vitamin K  Yohimbe     GU PSH: Hydrocele repair, Right - 02/18/2019     NON-GU PSH: Ankle Arthroscopy/surgery, Right Knee Arthroscopy, Bilateral Shoulder Arthroscopy/surgery, Bilateral Sinus surgery Wrist Arthroscopy, Bilateral     GU PMH: Hydrocele - 10/31/2018      PMH Notes:   1) Right hydrocele: He is s/p right hydrocele repair on 02/18/19.   NON-GU PMH: Arthritis Hypercholesterolemia Hypertension Hypothyroidism Sleep Apnea    FAMILY HISTORY: Congestive Heart Failure - Father Hypercholesterolemia - Mother    Notes: 2 daughters   SOCIAL HISTORY: Marital Status: Married Preferred Language: English; Ethnicity: Not Hispanic Or Latino; Race: White Current Smoking Status: Patient has never smoked.   Tobacco Use Assessment Completed: Used Tobacco in last 30 days? Drinks 1 drink per month.  Drinks 1 caffeinated drink per day.    REVIEW OF SYSTEMS:    GU Review Male:   Patient denies frequent urination, hard to postpone urination, burning/ pain with urination, get up at night to urinate, leakage of urine, stream starts and stops, trouble starting your streams, and have to strain to urinate .  Gastrointestinal (Upper):   Patient denies nausea and vomiting.  Gastrointestinal (Lower):   Patient denies diarrhea and constipation.  Constitutional:   Patient denies fever, night sweats, weight loss, and fatigue.  Skin:   Patient denies skin rash/ lesion and itching.  Eyes:   Patient denies  double vision and blurred vision.  Ears/ Nose/ Throat:   Patient denies sore throat and sinus problems.  Hematologic/Lymphatic:   Patient denies swollen glands and easy bruising.  Cardiovascular:   Patient denies leg swelling and chest pains.  Respiratory:   Patient denies cough and shortness of breath.  Endocrine:   Patient denies excessive thirst.  Musculoskeletal:   Patient denies back pain and joint pain.  Neurological:   Patient denies headaches and dizziness.  Psychologic:   Patient denies depression and anxiety.   VITAL SIGNS:     Weight 305 lb / 138.35 kg  Height 73 in / 185.42 cm BMI 40.2 kg/m    MULTI-SYSTEM PHYSICAL EXAMINATION:    Constitutional: Well-nourished. No physical deformities. Normally developed. Good grooming.  Respiratory: No labored breathing, no use of accessory muscles.   Cardiovascular: Normal temperature, normal extremity pulses, no swelling, no varicosities.    ASSESSMENT:      ICD-10 Details  1 GU:   Hydrocele - N43.0    PLAN:      1. Right hydrocele:   We did review the risks of proceeding with a repeat right hydrocele repair. These are similar to the risks described to him for primary repair. These include but are not limited to bleeding, infection, recurrence, potential injury to the testis or surrounding structures, etc.  He will have an indwelling drain to optimize success and decrease risk of recurrence. I discussed the potential benefits and risks of the procedure, side effects of the proposed treatment, the likelihood of the patient achieving the  goals of the procedure, and any potential problems that might occur during the procedure or recuperation.  He gives informed consent to proceed.

## 2019-07-19 NOTE — Anesthesia Preprocedure Evaluation (Deleted)
Anesthesia Evaluation    Airway        Dental   Pulmonary           Cardiovascular hypertension,      Neuro/Psych    GI/Hepatic   Endo/Other    Renal/GU      Musculoskeletal   Abdominal   Peds  Hematology   Anesthesia Other Findings   Reproductive/Obstetrics                             Anesthesia Physical Anesthesia Plan  ASA:   Anesthesia Plan:    Post-op Pain Management:    Induction:   PONV Risk Score and Plan:   Airway Management Planned:   Additional Equipment:   Intra-op Plan:   Post-operative Plan:   Informed Consent:   Plan Discussed with:   Anesthesia Plan Comments: (Per cardiologist, "He is scheduled for hydrocele excision, low risk surgery in a low risk stress test patient. Okay to proceed.")        Anesthesia Quick Evaluation

## 2019-07-19 NOTE — Anesthesia Preprocedure Evaluation (Addendum)
Anesthesia Evaluation  Patient identified by MRN, date of birth, ID band Patient awake    Reviewed: Allergy & Precautions, NPO status , Patient's Chart, lab work & pertinent test results, reviewed documented beta blocker date and time   History of Anesthesia Complications Negative for: history of anesthetic complications  Airway Mallampati: II  TM Distance: >3 FB Neck ROM: Full    Dental no notable dental hx. (+) Dental Advisory Given   Pulmonary sleep apnea and Continuous Positive Airway Pressure Ventilation ,    Pulmonary exam normal        Cardiovascular hypertension, Pt. on medications and Pt. on home beta blockers Normal cardiovascular exam  Per cardiologist, "He is scheduled for hydrocele excision, low risk surgery in a low risk stress test patient. Okay to proceed." Lexiscan Sestamibi stress test 04/01/2019: Lexiscan stress test was performed. Stress EKG is non-diagnostic, as this is pharmacological stress test. Myocardial perfusion imaging is normal. LVEF 57%. Low risk study. Echocardiogram 01/10/2019: Evaluation limited due to suboptimal acoustic windows. Normal LV systolic  function with EF 60%. Left ventricle cavity is normal in size. Moderate  concentric hypertrophy of the left ventricle. Normal global wall motion.  Doppler evidence of grade I (impaired) diastolic dysfunction, normal LAP.  Calculated EF 60%. Mild (Grade I) mitral regurgitation. Small pericardial effusion.       Neuro/Psych  Headaches, PSYCHIATRIC DISORDERS    GI/Hepatic negative GI ROS, Neg liver ROS,   Endo/Other  Hypothyroidism Morbid obesity  Renal/GU negative Renal ROS     Musculoskeletal negative musculoskeletal ROS (+)   Abdominal (+) + obese,   Peds  Hematology negative hematology ROS (+)   Anesthesia Other Findings RIGHT HYDROCELE  Reproductive/Obstetrics                           Anesthesia  Physical  Anesthesia Plan  ASA: III  Anesthesia Plan: General   Post-op Pain Management:    Induction: Intravenous  PONV Risk Score and Plan: 2 and Ondansetron, Dexamethasone, Midazolam and Treatment may vary due to age or medical condition  Airway Management Planned: LMA  Additional Equipment:   Intra-op Plan:   Post-operative Plan: Extubation in OR  Informed Consent: I have reviewed the patients History and Physical, chart, labs and discussed the procedure including the risks, benefits and alternatives for the proposed anesthesia with the patient or authorized representative who has indicated his/her understanding and acceptance.     Dental advisory given  Plan Discussed with: CRNA and Anesthesiologist  Anesthesia Plan Comments:        Anesthesia Quick Evaluation

## 2019-07-21 MED ORDER — DEXTROSE 5 % IV SOLN
3.0000 g | INTRAVENOUS | Status: AC
  Administered 2019-07-22: 3 g via INTRAVENOUS
  Filled 2019-07-21: qty 3

## 2019-07-22 ENCOUNTER — Other Ambulatory Visit: Payer: Self-pay

## 2019-07-22 ENCOUNTER — Encounter (HOSPITAL_COMMUNITY): Payer: Self-pay | Admitting: Emergency Medicine

## 2019-07-22 ENCOUNTER — Ambulatory Visit (HOSPITAL_COMMUNITY): Payer: BC Managed Care – PPO | Admitting: Anesthesiology

## 2019-07-22 ENCOUNTER — Encounter (HOSPITAL_COMMUNITY)
Admission: RE | Disposition: A | Discharge status: Home / Self Care | Payer: Self-pay | Source: Home / Self Care | Attending: Urology

## 2019-07-22 ENCOUNTER — Ambulatory Visit (HOSPITAL_COMMUNITY): Payer: BC Managed Care – PPO | Admitting: Physician Assistant

## 2019-07-22 ENCOUNTER — Ambulatory Visit (HOSPITAL_COMMUNITY)
Admission: RE | Admit: 2019-07-22 | Discharge: 2019-07-22 | Disposition: A | Discharge status: Home / Self Care | Payer: BC Managed Care – PPO | Attending: Urology | Admitting: Urology

## 2019-07-22 ENCOUNTER — Telehealth (HOSPITAL_COMMUNITY): Payer: Self-pay | Admitting: *Deleted

## 2019-07-22 DIAGNOSIS — Z79890 Hormone replacement therapy (postmenopausal): Secondary | ICD-10-CM | POA: Diagnosis not present

## 2019-07-22 DIAGNOSIS — G4730 Sleep apnea, unspecified: Secondary | ICD-10-CM | POA: Insufficient documentation

## 2019-07-22 DIAGNOSIS — E7800 Pure hypercholesterolemia, unspecified: Secondary | ICD-10-CM | POA: Insufficient documentation

## 2019-07-22 DIAGNOSIS — Z79899 Other long term (current) drug therapy: Secondary | ICD-10-CM | POA: Insufficient documentation

## 2019-07-22 DIAGNOSIS — Z6841 Body Mass Index (BMI) 40.0 and over, adult: Secondary | ICD-10-CM | POA: Diagnosis not present

## 2019-07-22 DIAGNOSIS — Z881 Allergy status to other antibiotic agents status: Secondary | ICD-10-CM | POA: Insufficient documentation

## 2019-07-22 DIAGNOSIS — N433 Hydrocele, unspecified: Secondary | ICD-10-CM | POA: Insufficient documentation

## 2019-07-22 DIAGNOSIS — M1990 Unspecified osteoarthritis, unspecified site: Secondary | ICD-10-CM | POA: Diagnosis not present

## 2019-07-22 DIAGNOSIS — Z885 Allergy status to narcotic agent status: Secondary | ICD-10-CM | POA: Diagnosis not present

## 2019-07-22 DIAGNOSIS — Z888 Allergy status to other drugs, medicaments and biological substances status: Secondary | ICD-10-CM | POA: Insufficient documentation

## 2019-07-22 DIAGNOSIS — E039 Hypothyroidism, unspecified: Secondary | ICD-10-CM | POA: Diagnosis not present

## 2019-07-22 DIAGNOSIS — I10 Essential (primary) hypertension: Secondary | ICD-10-CM | POA: Diagnosis not present

## 2019-07-22 HISTORY — PX: HYDROCELE EXCISION: SHX482

## 2019-07-22 SURGERY — HYDROCELECTOMY
Anesthesia: General | Laterality: Right

## 2019-07-22 MED ORDER — BUPIVACAINE HCL (PF) 0.25 % IJ SOLN
INTRAMUSCULAR | Status: AC
  Filled 2019-07-22: qty 30

## 2019-07-22 MED ORDER — PROPOFOL 10 MG/ML IV BOLUS
INTRAVENOUS | Status: AC
  Filled 2019-07-22: qty 20

## 2019-07-22 MED ORDER — CELECOXIB 200 MG PO CAPS
400.0000 mg | ORAL_CAPSULE | Freq: Once | ORAL | Status: AC
  Administered 2019-07-22: 06:00:00 400 mg via ORAL

## 2019-07-22 MED ORDER — TRAMADOL HCL 50 MG PO TABS
ORAL_TABLET | ORAL | Status: AC
  Filled 2019-07-22: qty 1

## 2019-07-22 MED ORDER — DEXAMETHASONE SODIUM PHOSPHATE 10 MG/ML IJ SOLN
INTRAMUSCULAR | Status: AC
  Filled 2019-07-22: qty 1

## 2019-07-22 MED ORDER — CELECOXIB 200 MG PO CAPS
ORAL_CAPSULE | ORAL | Status: AC
  Administered 2019-07-22: 06:00:00 400 mg via ORAL
  Filled 2019-07-22: qty 2

## 2019-07-22 MED ORDER — ACETAMINOPHEN 500 MG PO TABS
1000.0000 mg | ORAL_TABLET | Freq: Once | ORAL | Status: AC
  Administered 2019-07-22: 06:00:00 1000 mg via ORAL

## 2019-07-22 MED ORDER — FENTANYL CITRATE (PF) 100 MCG/2ML IJ SOLN: 25.0000 ug | INTRAMUSCULAR | Status: DC | PRN

## 2019-07-22 MED ORDER — LIDOCAINE 2% (20 MG/ML) 5 ML SYRINGE
INTRAMUSCULAR | Status: DC | PRN
  Administered 2019-07-22: 60 mg via INTRAVENOUS

## 2019-07-22 MED ORDER — EPHEDRINE 5 MG/ML INJ
INTRAVENOUS | Status: AC
  Filled 2019-07-22: qty 10

## 2019-07-22 MED ORDER — FENTANYL CITRATE (PF) 100 MCG/2ML IJ SOLN
INTRAMUSCULAR | Status: DC | PRN
  Administered 2019-07-22: 100 ug via INTRAVENOUS
  Administered 2019-07-22 (×2): 25 ug via INTRAVENOUS
  Administered 2019-07-22: 50 ug via INTRAVENOUS

## 2019-07-22 MED ORDER — BUPIVACAINE HCL (PF) 0.25 % IJ SOLN
INTRAMUSCULAR | Status: DC | PRN
  Administered 2019-07-22: 20 mL

## 2019-07-22 MED ORDER — DEXAMETHASONE SODIUM PHOSPHATE 10 MG/ML IJ SOLN
INTRAMUSCULAR | Status: DC | PRN
  Administered 2019-07-22: 8 mg via INTRAVENOUS

## 2019-07-22 MED ORDER — PROPOFOL 10 MG/ML IV BOLUS
INTRAVENOUS | Status: DC | PRN
  Administered 2019-07-22: 200 mg via INTRAVENOUS

## 2019-07-22 MED ORDER — LACTATED RINGERS IV SOLN
INTRAVENOUS | Status: DC
  Administered 2019-07-22 (×2): via INTRAVENOUS

## 2019-07-22 MED ORDER — 0.9 % SODIUM CHLORIDE (POUR BTL) OPTIME
TOPICAL | Status: DC | PRN
  Administered 2019-07-22: 1000 mL

## 2019-07-22 MED ORDER — ONDANSETRON HCL 4 MG/2ML IJ SOLN
INTRAMUSCULAR | Status: AC
  Filled 2019-07-22: qty 2

## 2019-07-22 MED ORDER — ACETAMINOPHEN 500 MG PO TABS
ORAL_TABLET | ORAL | Status: AC
  Administered 2019-07-22: 1000 mg via ORAL
  Filled 2019-07-22: qty 2

## 2019-07-22 MED ORDER — TRAMADOL HCL 50 MG PO TABS
50.0000 mg | ORAL_TABLET | Freq: Four times a day (QID) | ORAL | Status: DC | PRN
  Administered 2019-07-22 (×2): 50 mg via ORAL

## 2019-07-22 MED ORDER — PHENYLEPHRINE 40 MCG/ML (10ML) SYRINGE FOR IV PUSH (FOR BLOOD PRESSURE SUPPORT)
PREFILLED_SYRINGE | INTRAVENOUS | Status: AC
  Filled 2019-07-22: qty 10

## 2019-07-22 MED ORDER — ONDANSETRON HCL 4 MG/2ML IJ SOLN
INTRAMUSCULAR | Status: DC | PRN
  Administered 2019-07-22: 4 mg via INTRAVENOUS

## 2019-07-22 MED ORDER — MIDAZOLAM HCL 5 MG/5ML IJ SOLN
INTRAMUSCULAR | Status: DC | PRN
  Administered 2019-07-22: 2 mg via INTRAVENOUS

## 2019-07-22 MED ORDER — FENTANYL CITRATE (PF) 100 MCG/2ML IJ SOLN
INTRAMUSCULAR | Status: AC
  Filled 2019-07-22: qty 2

## 2019-07-22 MED ORDER — TRAMADOL HCL 50 MG PO TABS: 50.0000 mg | ORAL_TABLET | Freq: Four times a day (QID) | ORAL | 0 refills | Status: AC | PRN

## 2019-07-22 MED ORDER — GLYCOPYRROLATE 0.2 MG/ML IJ SOLN
INTRAMUSCULAR | Status: DC | PRN
  Administered 2019-07-22: 0.2 mg via INTRAVENOUS

## 2019-07-22 MED ORDER — PROMETHAZINE HCL 25 MG/ML IJ SOLN: 6.2500 mg | INTRAMUSCULAR | Status: DC | PRN

## 2019-07-22 MED ORDER — MIDAZOLAM HCL 2 MG/2ML IJ SOLN
INTRAMUSCULAR | Status: AC
  Filled 2019-07-22: qty 2

## 2019-07-22 MED ORDER — LIDOCAINE 2% (20 MG/ML) 5 ML SYRINGE
INTRAMUSCULAR | Status: AC
  Filled 2019-07-22: qty 5

## 2019-07-22 SURGICAL SUPPLY — 26 items
BLADE HEX COATED 2.75 (ELECTRODE) ×3 IMPLANT
BNDG GAUZE ELAST 4 BULKY (GAUZE/BANDAGES/DRESSINGS) ×3 IMPLANT
BRIEF STRETCH FOR OB PAD LRG (UNDERPADS AND DIAPERS) ×2 IMPLANT
CONT SPEC 4OZ CLIKSEAL STRL BL (MISCELLANEOUS) ×2 IMPLANT
COVER SURGICAL LIGHT HANDLE (MISCELLANEOUS) ×3 IMPLANT
COVER WAND RF STERILE (DRAPES) IMPLANT
DRAIN PENROSE 18X1/4 LTX STRL (WOUND CARE) ×2 IMPLANT
ELECT REM PT RETURN 15FT ADLT (MISCELLANEOUS) ×3 IMPLANT
GLOVE BIOGEL M STRL SZ7.5 (GLOVE) ×3 IMPLANT
GOWN STRL REUS W/TWL LRG LVL3 (GOWN DISPOSABLE) ×3 IMPLANT
KIT BASIN OR (CUSTOM PROCEDURE TRAY) ×3 IMPLANT
KIT TURNOVER KIT A (KITS) ×2 IMPLANT
NEEDLE HYPO 22GX1.5 SAFETY (NEEDLE) ×2 IMPLANT
NS IRRIG 1000ML POUR BTL (IV SOLUTION) ×1 IMPLANT
PACK GENERAL/GYN (CUSTOM PROCEDURE TRAY) ×3 IMPLANT
PENCIL SMOKE EVACUATOR (MISCELLANEOUS) IMPLANT
SUPPORT SCROTAL LG STRP (MISCELLANEOUS) ×1 IMPLANT
SUPPORTER ATHLETIC LG (MISCELLANEOUS)
SUT CHROMIC 3 0 SH 27 (SUTURE) ×6 IMPLANT
SUT ETHILON 3 0 PS 1 (SUTURE) ×2 IMPLANT
SUT VIC AB 2-0 UR5 27 (SUTURE) IMPLANT
SUT VICRYL 0 TIES 12 18 (SUTURE) IMPLANT
SYR CONTROL 10ML LL (SYRINGE) ×2 IMPLANT
TOWEL OR 17X26 10 PK STRL BLUE (TOWEL DISPOSABLE) ×4 IMPLANT
WATER STERILE IRR 1000ML POUR (IV SOLUTION) ×1 IMPLANT
YANKAUER SUCT BULB TIP 10FT TU (MISCELLANEOUS) IMPLANT

## 2019-07-22 NOTE — Transfer of Care (Signed)
Immediate Anesthesia Transfer of Care Note  Patient: Darryl [name]  Procedure(s) Performed: Procedure(s): HYDROCELE REPAIR (Right)  Patient Location: PACU  Anesthesia Type:General  Level of Consciousness: Alert, Awake, Oriented  Airway & Oxygen Therapy: Patient Spontanous Breathing  Post-op Assessment: Report given to RN  Post vital signs: Reviewed and stable  Last Vitals:  Vitals:   07/22/19 0606 07/22/19 0834  BP: 128/87 120/71  Pulse: 67 68  Resp: 18 16  Temp: 36.9 C 36.5 C  SpO2: 16% 10%    Complications: No apparent anesthesia complications

## 2019-07-22 NOTE — Op Note (Signed)
Preoperative diagnosis:  1. Right hydrocele  Postoperative diagnosis: 1. Right hydrocele  Procedure(s): 1. Right hydrocele repair  Surgeon: Dr. 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  He reformed a fluid collection in the right hemiscrotum with associated discomfort about 3 weeks after his initial repair.  He underwent aspiration in September with recollection of the fluid about 3 weeks afterward.  I discussed the potential benefits and risks of the procedure, side effects of the proposed treatment, the likelihood of the patient achieving the goals of the procedure, and any potential problems that might occur during the procedure or recuperation.  He gives informed consent.  Description of procedure:  The patient was taken to the operating room and general anesthesia was induced.  He was given preoperative antibiotics, prepped and draped in the the usual sterile fashion while positioned supinely.  A preoperative timeout was performed.  The right hemiscrotum was examined and there was a fluid collection in the dependent scrotum with another palpable enlarged cystic-feeling structure superiorly but separate from the main fluid collection.  An incision was made in the median scrotal raphe and carried down through the dartos fascia onto the fluid collection.  This was adherent to the surrounding structure and was not easily separated from the dartos tissue.  With careful cautery and blunt dissection, the hydrocele sac was eventually delivered out of the scrotum along with  The superior spermatic cord.  The hydrocele was then opened and about 125 cc of clear fluid was expelled.  Inspection revealed an empty sac of what was likely a psuedohydrocele that had formed inferior to the  testis.  The superior structure that was palpated was the testis itself.  The pseudocapsule of the recurrent hydrocele was opened and completely excised.  The tissue surrounding the testis was also opened to allow inspection of the testis which appeared viable and of normal size.  The testis was then placed back into a dependent position in the right hemiscrotum.  A quarter inch penrose drain was brought through a separate opening in the inferior scrotum and secured to the skin with a nylon suture.  The dartos layer was then closed with a running 2-0 chromic suture.  The skin was closed with a running vertical mattress 2-0 chromic suture. 20 cc of 0.25% plan bupivacaine was then injected into the scrotum.  A fluff dressing was applied.  He tolerated the procedure well and without complications.  He was able to be awakened and transferred to the PACU in stable condition.

## 2019-07-22 NOTE — Anesthesia Postprocedure Evaluation (Signed)
Anesthesia Post Note  Patient: Darryl [name]  Procedure(s) Performed: HYDROCELE REPAIR (Right )     Patient location during evaluation: PACU Anesthesia Type: General Level of consciousness: sedated Pain management: pain level controlled Vital Signs Assessment: post-procedure vital signs reviewed and stable Respiratory status: spontaneous breathing and respiratory function stable Cardiovascular status: stable Postop Assessment: no apparent nausea or vomiting Anesthetic complications: no    Last Vitals:  Vitals:   07/22/19 0915 07/22/19 0930  BP: 91/79 121/77  Pulse: 69 65  Resp: (!) 23 16  Temp:  (!) 36.3 C  SpO2: 96% 95%    Last Pain:  Vitals:   07/22/19 0930  TempSrc:   PainSc: 5                  Gareth Fitzner DANIEL

## 2019-07-22 NOTE — Discharge Instructions (Signed)
1. Call if fever > 101 or uncontrolled pain. 2. Continue to use gauze dressing in underwear while drain is in place and change out gauze as needed. 3. Avoid lifting > 10 lbs or strenuous exercise or activity. 4. Return to driving only when you feel you are just as safe as you normally would be driving.

## 2019-07-22 NOTE — Anesthesia Procedure Notes (Signed)
Procedure Name: LMA Insertion Date/Time: 07/22/2019 7:26 AM Performed by: Gerald Leitz, CRNA Pre-anesthesia Checklist: Patient identified, Patient being monitored, Timeout performed, Emergency Drugs available and Suction available Patient Re-evaluated:Patient Re-evaluated prior to induction Oxygen Delivery Method: Circle system utilized Preoxygenation: Pre-oxygenation with 100% oxygen Induction Type: IV induction Ventilation: Mask ventilation without difficulty LMA: LMA inserted LMA Size: 4.0 Grade View: Grade II Tube type: Oral Number of attempts: 1 Placement Confirmation: positive ETCO2 and breath sounds checked- equal and bilateral Tube secured with: Tape Dental Injury: Teeth and Oropharynx as per pre-operative assessment

## 2019-07-23 ENCOUNTER — Encounter (HOSPITAL_COMMUNITY): Payer: Self-pay | Admitting: Urology

## 2019-07-24 LAB — SURGICAL PATHOLOGY

## 2019-08-02 ENCOUNTER — Other Ambulatory Visit: Payer: Self-pay

## 2019-08-02 MED ORDER — NALTREXONE-BUPROPION HCL ER 8-90 MG PO TB12: ORAL_TABLET | ORAL | 11 refills | Status: AC

## 2019-10-02 ENCOUNTER — Ambulatory Visit: Payer: BC Managed Care – PPO | Attending: Internal Medicine

## 2019-10-02 DIAGNOSIS — Z20822 Contact with and (suspected) exposure to covid-19: Secondary | ICD-10-CM

## 2019-10-03 LAB — NOVEL CORONAVIRUS, NAA: SARS-CoV-2, NAA: DETECTED — AB

## 2019-10-04 ENCOUNTER — Telehealth: Payer: Self-pay

## 2019-10-04 ENCOUNTER — Telehealth: Payer: Self-pay | Admitting: Nurse Practitioner

## 2019-10-04 NOTE — Telephone Encounter (Signed)
Pt. Called to ask questions about travel with COVID.  Reported he needs a negative test to travel by plane.  Advised that he may continue to show a positive test for 60-90 days from his initial test.  Pt. Also asked about how to handle his pets with being COVID positive.  Advised that based on the literature, humans can spread COVID to animals.  Advised if he has a pet in the home, to keep the pet isolated from others for 14 days from his test date, due to direct exposure.  Pt. Verb. Understanding.  All questions answered.

## 2019-10-04 NOTE — Telephone Encounter (Signed)
Called to Discuss with patient about Covid symptoms and the use of bamlanivimab, a monoclonal antibody infusion for those with mild to moderate Covid symptoms and at a high risk of hospitalization.     Pt is qualified for this infusion at the [facility] infusion center due to co-morbid conditions and/or a member of an at-risk group.     Patient Active Problem List   Diagnosis Date Noted  . HTN (hypertension) 01/30/2019  . Hyperlipemia 01/30/2019  . Hypertrophy of both inferior nasal turbinates 08/16/2017    Patient declines infusion at this time. Symptoms tier reviewed as well as criteria for ending isolation. Preventative practices reviewed. Patient verbalized understanding.    Patient advised to call back if he decides that he does want to get infusion. Callback number to the infusion center given. Patient advised to go to Urgent care or ED with severe symptoms.   Symptoms started 09/30/19

## 2019-10-18 ENCOUNTER — Other Ambulatory Visit: Payer: BC Managed Care – PPO

## 2019-10-21 ENCOUNTER — Ambulatory Visit: Payer: BC Managed Care – PPO | Attending: Internal Medicine

## 2019-10-21 DIAGNOSIS — Z20822 Contact with and (suspected) exposure to covid-19: Secondary | ICD-10-CM

## 2019-10-22 LAB — NOVEL CORONAVIRUS, NAA: SARS-CoV-2, NAA: NOT DETECTED

## 2020-04-29 ENCOUNTER — Other Ambulatory Visit: Payer: BC Managed Care – PPO

## 2020-04-29 ENCOUNTER — Other Ambulatory Visit: Payer: Self-pay

## 2020-04-29 DIAGNOSIS — Z20822 Contact with and (suspected) exposure to covid-19: Secondary | ICD-10-CM

## 2020-05-01 LAB — NOVEL CORONAVIRUS, NAA: SARS-CoV-2, NAA: NOT DETECTED

## 2020-05-06 ENCOUNTER — Other Ambulatory Visit: Payer: Self-pay

## 2020-05-06 ENCOUNTER — Other Ambulatory Visit: Payer: BC Managed Care – PPO

## 2020-05-06 DIAGNOSIS — Z20822 Contact with and (suspected) exposure to covid-19: Secondary | ICD-10-CM

## 2020-05-08 LAB — SARS-COV-2, NAA 2 DAY TAT

## 2020-05-08 LAB — NOVEL CORONAVIRUS, NAA: SARS-CoV-2, NAA: NOT DETECTED

## 2020-05-13 ENCOUNTER — Other Ambulatory Visit: Payer: Self-pay

## 2020-05-13 ENCOUNTER — Other Ambulatory Visit: Payer: BC Managed Care – PPO

## 2020-05-13 DIAGNOSIS — Z20822 Contact with and (suspected) exposure to covid-19: Secondary | ICD-10-CM

## 2020-05-15 LAB — SARS-COV-2, NAA 2 DAY TAT

## 2020-05-15 LAB — NOVEL CORONAVIRUS, NAA: SARS-CoV-2, NAA: NOT DETECTED

## 2020-05-20 ENCOUNTER — Other Ambulatory Visit: Payer: BC Managed Care – PPO

## 2020-05-20 DIAGNOSIS — Z20822 Contact with and (suspected) exposure to covid-19: Secondary | ICD-10-CM

## 2020-05-21 LAB — SARS-COV-2, NAA 2 DAY TAT

## 2020-05-21 LAB — NOVEL CORONAVIRUS, NAA: SARS-CoV-2, NAA: NOT DETECTED

## 2020-06-03 ENCOUNTER — Other Ambulatory Visit: Payer: BC Managed Care – PPO

## 2020-06-03 DIAGNOSIS — Z20822 Contact with and (suspected) exposure to covid-19: Secondary | ICD-10-CM

## 2020-06-04 LAB — NOVEL CORONAVIRUS, NAA: SARS-CoV-2, NAA: NOT DETECTED

## 2020-06-04 LAB — SARS-COV-2, NAA 2 DAY TAT

## 2020-06-10 ENCOUNTER — Other Ambulatory Visit: Payer: BC Managed Care – PPO

## 2020-06-10 DIAGNOSIS — Z20822 Contact with and (suspected) exposure to covid-19: Secondary | ICD-10-CM

## 2020-06-12 LAB — NOVEL CORONAVIRUS, NAA: SARS-CoV-2, NAA: NOT DETECTED

## 2020-06-12 LAB — SARS-COV-2, NAA 2 DAY TAT

## 2020-06-17 ENCOUNTER — Other Ambulatory Visit: Payer: BC Managed Care – PPO

## 2020-06-17 DIAGNOSIS — Z20822 Contact with and (suspected) exposure to covid-19: Secondary | ICD-10-CM

## 2020-06-18 LAB — NOVEL CORONAVIRUS, NAA: SARS-CoV-2, NAA: NOT DETECTED

## 2020-06-18 LAB — SARS-COV-2, NAA 2 DAY TAT

## 2020-06-24 ENCOUNTER — Other Ambulatory Visit: Payer: BC Managed Care – PPO

## 2020-06-24 DIAGNOSIS — Z20822 Contact with and (suspected) exposure to covid-19: Secondary | ICD-10-CM

## 2020-06-25 LAB — NOVEL CORONAVIRUS, NAA: SARS-CoV-2, NAA: NOT DETECTED

## 2020-06-25 LAB — SARS-COV-2, NAA 2 DAY TAT

## 2020-06-26 ENCOUNTER — Ambulatory Visit: Payer: BC Managed Care – PPO | Admitting: Cardiology

## 2020-07-02 ENCOUNTER — Other Ambulatory Visit: Payer: BC Managed Care – PPO

## 2020-07-02 DIAGNOSIS — Z20822 Contact with and (suspected) exposure to covid-19: Secondary | ICD-10-CM

## 2020-07-03 LAB — SARS-COV-2, NAA 2 DAY TAT

## 2020-07-03 LAB — NOVEL CORONAVIRUS, NAA: SARS-CoV-2, NAA: NOT DETECTED

## 2020-07-09 ENCOUNTER — Other Ambulatory Visit: Payer: BC Managed Care – PPO

## 2020-07-09 DIAGNOSIS — Z20822 Contact with and (suspected) exposure to covid-19: Secondary | ICD-10-CM

## 2020-07-11 LAB — NOVEL CORONAVIRUS, NAA: SARS-CoV-2, NAA: NOT DETECTED

## 2020-07-11 LAB — SARS-COV-2, NAA 2 DAY TAT

## 2020-07-16 ENCOUNTER — Other Ambulatory Visit: Payer: BC Managed Care – PPO

## 2020-07-16 DIAGNOSIS — Z20822 Contact with and (suspected) exposure to covid-19: Secondary | ICD-10-CM

## 2020-07-17 LAB — SARS-COV-2, NAA 2 DAY TAT

## 2020-07-17 LAB — NOVEL CORONAVIRUS, NAA: SARS-CoV-2, NAA: NOT DETECTED

## 2020-07-22 ENCOUNTER — Other Ambulatory Visit: Payer: BC Managed Care – PPO

## 2020-07-22 ENCOUNTER — Other Ambulatory Visit: Payer: Self-pay

## 2020-07-22 DIAGNOSIS — Z20822 Contact with and (suspected) exposure to covid-19: Secondary | ICD-10-CM

## 2020-07-23 LAB — SARS-COV-2, NAA 2 DAY TAT

## 2020-07-23 LAB — NOVEL CORONAVIRUS, NAA: SARS-CoV-2, NAA: NOT DETECTED

## 2020-07-25 ENCOUNTER — Other Ambulatory Visit: Payer: Self-pay | Admitting: Cardiology

## 2020-07-25 DIAGNOSIS — I10 Essential (primary) hypertension: Secondary | ICD-10-CM

## 2020-07-30 ENCOUNTER — Other Ambulatory Visit: Payer: Self-pay

## 2020-07-30 ENCOUNTER — Other Ambulatory Visit: Payer: BC Managed Care – PPO

## 2020-07-30 DIAGNOSIS — I10 Essential (primary) hypertension: Secondary | ICD-10-CM

## 2020-07-30 DIAGNOSIS — Z20822 Contact with and (suspected) exposure to covid-19: Secondary | ICD-10-CM

## 2020-07-30 MED ORDER — AMLODIPINE BESYLATE 10 MG PO TABS: 10.0000 mg | ORAL_TABLET | Freq: Every day | ORAL | 0 refills | Status: DC

## 2020-07-30 MED ORDER — LOSARTAN POTASSIUM-HCTZ 100-25 MG PO TABS: 1.0000 | ORAL_TABLET | Freq: Every day | ORAL | 0 refills | Status: DC

## 2020-07-30 MED ORDER — ATENOLOL 50 MG PO TABS: 25.0000 mg | ORAL_TABLET | Freq: Two times a day (BID) | ORAL | 0 refills | Status: DC

## 2020-07-31 LAB — SARS-COV-2, NAA 2 DAY TAT

## 2020-07-31 LAB — NOVEL CORONAVIRUS, NAA: SARS-CoV-2, NAA: NOT DETECTED

## 2020-08-06 ENCOUNTER — Other Ambulatory Visit: Payer: BC Managed Care – PPO

## 2020-08-06 DIAGNOSIS — Z20822 Contact with and (suspected) exposure to covid-19: Secondary | ICD-10-CM

## 2020-08-08 LAB — NOVEL CORONAVIRUS, NAA: SARS-CoV-2, NAA: NOT DETECTED

## 2020-08-08 LAB — SARS-COV-2, NAA 2 DAY TAT

## 2020-08-13 ENCOUNTER — Other Ambulatory Visit: Payer: BC Managed Care – PPO

## 2020-08-13 DIAGNOSIS — Z20822 Contact with and (suspected) exposure to covid-19: Secondary | ICD-10-CM

## 2020-08-15 LAB — NOVEL CORONAVIRUS, NAA: SARS-CoV-2, NAA: NOT DETECTED

## 2020-08-15 LAB — SARS-COV-2, NAA 2 DAY TAT

## 2020-08-20 ENCOUNTER — Other Ambulatory Visit: Payer: BC Managed Care – PPO

## 2020-08-20 DIAGNOSIS — Z20822 Contact with and (suspected) exposure to covid-19: Secondary | ICD-10-CM

## 2020-08-22 LAB — NOVEL CORONAVIRUS, NAA: SARS-CoV-2, NAA: NOT DETECTED

## 2020-08-22 LAB — SARS-COV-2, NAA 2 DAY TAT

## 2020-08-27 ENCOUNTER — Other Ambulatory Visit: Payer: BC Managed Care – PPO

## 2020-08-27 DIAGNOSIS — Z20822 Contact with and (suspected) exposure to covid-19: Secondary | ICD-10-CM

## 2020-08-29 ENCOUNTER — Other Ambulatory Visit: Payer: Self-pay | Admitting: Cardiology

## 2020-08-29 DIAGNOSIS — I10 Essential (primary) hypertension: Secondary | ICD-10-CM

## 2020-08-29 LAB — NOVEL CORONAVIRUS, NAA: SARS-CoV-2, NAA: NOT DETECTED

## 2020-08-29 LAB — SARS-COV-2, NAA 2 DAY TAT

## 2020-08-30 ENCOUNTER — Other Ambulatory Visit: Payer: Self-pay | Admitting: Cardiology

## 2020-08-30 DIAGNOSIS — I10 Essential (primary) hypertension: Secondary | ICD-10-CM

## 2020-09-03 ENCOUNTER — Other Ambulatory Visit: Payer: BC Managed Care – PPO

## 2020-09-03 DIAGNOSIS — Z20822 Contact with and (suspected) exposure to covid-19: Secondary | ICD-10-CM

## 2020-09-04 LAB — SARS-COV-2, NAA 2 DAY TAT

## 2020-09-04 LAB — NOVEL CORONAVIRUS, NAA: SARS-CoV-2, NAA: NOT DETECTED

## 2020-09-04 LAB — SPECIMEN STATUS REPORT

## 2020-09-10 ENCOUNTER — Other Ambulatory Visit: Payer: Self-pay

## 2020-09-10 DIAGNOSIS — Z20822 Contact with and (suspected) exposure to covid-19: Secondary | ICD-10-CM

## 2020-09-13 LAB — NOVEL CORONAVIRUS, NAA: SARS-CoV-2, NAA: NOT DETECTED

## 2020-09-17 ENCOUNTER — Other Ambulatory Visit: Payer: Self-pay

## 2020-09-17 DIAGNOSIS — Z20822 Contact with and (suspected) exposure to covid-19: Secondary | ICD-10-CM

## 2020-09-19 LAB — NOVEL CORONAVIRUS, NAA: SARS-CoV-2, NAA: NOT DETECTED

## 2020-09-19 LAB — SARS-COV-2, NAA 2 DAY TAT

## 2020-09-24 ENCOUNTER — Other Ambulatory Visit: Payer: Self-pay

## 2020-09-24 DIAGNOSIS — Z20822 Contact with and (suspected) exposure to covid-19: Secondary | ICD-10-CM

## 2020-09-25 LAB — NOVEL CORONAVIRUS, NAA: SARS-CoV-2, NAA: NOT DETECTED

## 2020-09-25 LAB — SARS-COV-2, NAA 2 DAY TAT

## 2020-10-01 ENCOUNTER — Other Ambulatory Visit: Payer: Self-pay

## 2020-10-01 DIAGNOSIS — Z20822 Contact with and (suspected) exposure to covid-19: Secondary | ICD-10-CM

## 2020-10-03 LAB — SPECIMEN STATUS REPORT

## 2020-10-03 LAB — SARS-COV-2, NAA 2 DAY TAT

## 2020-10-03 LAB — NOVEL CORONAVIRUS, NAA: SARS-CoV-2, NAA: NOT DETECTED

## 2020-10-09 ENCOUNTER — Other Ambulatory Visit: Payer: BC Managed Care – PPO

## 2020-10-09 DIAGNOSIS — Z20822 Contact with and (suspected) exposure to covid-19: Secondary | ICD-10-CM

## 2020-10-10 LAB — NOVEL CORONAVIRUS, NAA: SARS-CoV-2, NAA: NOT DETECTED

## 2020-10-10 LAB — SARS-COV-2, NAA 2 DAY TAT

## 2020-10-12 ENCOUNTER — Other Ambulatory Visit: Payer: Self-pay | Admitting: Cardiology

## 2020-10-12 DIAGNOSIS — I10 Essential (primary) hypertension: Secondary | ICD-10-CM

## 2020-10-15 ENCOUNTER — Other Ambulatory Visit: Payer: BC Managed Care – PPO

## 2020-10-15 DIAGNOSIS — Z20822 Contact with and (suspected) exposure to covid-19: Secondary | ICD-10-CM

## 2020-10-16 LAB — SARS-COV-2, NAA 2 DAY TAT

## 2020-10-16 LAB — NOVEL CORONAVIRUS, NAA: SARS-CoV-2, NAA: NOT DETECTED

## 2020-10-16 LAB — SPECIMEN STATUS REPORT

## 2020-10-20 ENCOUNTER — Other Ambulatory Visit: Payer: Self-pay | Admitting: Cardiology

## 2020-10-20 DIAGNOSIS — Z6841 Body Mass Index (BMI) 40.0 and over, adult: Secondary | ICD-10-CM

## 2020-10-22 ENCOUNTER — Other Ambulatory Visit: Payer: BC Managed Care – PPO

## 2020-10-22 DIAGNOSIS — Z20822 Contact with and (suspected) exposure to covid-19: Secondary | ICD-10-CM

## 2020-10-23 LAB — SARS-COV-2, NAA 2 DAY TAT

## 2020-10-23 LAB — NOVEL CORONAVIRUS, NAA: SARS-CoV-2, NAA: NOT DETECTED

## 2020-10-29 ENCOUNTER — Other Ambulatory Visit: Payer: BC Managed Care – PPO

## 2020-10-29 DIAGNOSIS — Z20822 Contact with and (suspected) exposure to covid-19: Secondary | ICD-10-CM

## 2020-10-30 LAB — SARS-COV-2, NAA 2 DAY TAT

## 2020-10-30 LAB — NOVEL CORONAVIRUS, NAA: SARS-CoV-2, NAA: NOT DETECTED

## 2020-11-05 ENCOUNTER — Other Ambulatory Visit: Payer: BC Managed Care – PPO

## 2020-11-05 DIAGNOSIS — Z20822 Contact with and (suspected) exposure to covid-19: Secondary | ICD-10-CM

## 2020-11-06 LAB — SARS-COV-2, NAA 2 DAY TAT

## 2020-11-06 LAB — NOVEL CORONAVIRUS, NAA: SARS-CoV-2, NAA: NOT DETECTED

## 2020-11-10 ENCOUNTER — Other Ambulatory Visit: Payer: BC Managed Care – PPO

## 2020-11-12 ENCOUNTER — Ambulatory Visit: Payer: BC Managed Care – PPO | Attending: Critical Care Medicine

## 2020-11-12 DIAGNOSIS — Z20822 Contact with and (suspected) exposure to covid-19: Secondary | ICD-10-CM

## 2020-11-13 LAB — SARS-COV-2, NAA 2 DAY TAT

## 2020-11-13 LAB — NOVEL CORONAVIRUS, NAA: SARS-CoV-2, NAA: NOT DETECTED

## 2020-11-19 ENCOUNTER — Ambulatory Visit: Payer: BC Managed Care – PPO | Attending: Critical Care Medicine

## 2020-11-19 DIAGNOSIS — Z20822 Contact with and (suspected) exposure to covid-19: Secondary | ICD-10-CM

## 2020-11-20 LAB — NOVEL CORONAVIRUS, NAA: SARS-CoV-2, NAA: NOT DETECTED

## 2020-11-20 LAB — SARS-COV-2, NAA 2 DAY TAT

## 2020-11-26 ENCOUNTER — Ambulatory Visit: Payer: BC Managed Care – PPO | Attending: Internal Medicine

## 2020-11-26 DIAGNOSIS — Z20822 Contact with and (suspected) exposure to covid-19: Secondary | ICD-10-CM

## 2020-11-27 LAB — SARS-COV-2, NAA 2 DAY TAT

## 2020-11-27 LAB — SPECIMEN STATUS REPORT

## 2020-11-27 LAB — NOVEL CORONAVIRUS, NAA: SARS-CoV-2, NAA: NOT DETECTED

## 2020-12-03 ENCOUNTER — Ambulatory Visit: Payer: BC Managed Care – PPO | Attending: Critical Care Medicine

## 2020-12-03 DIAGNOSIS — Z20822 Contact with and (suspected) exposure to covid-19: Secondary | ICD-10-CM

## 2020-12-04 LAB — NOVEL CORONAVIRUS, NAA: SARS-CoV-2, NAA: NOT DETECTED

## 2020-12-04 LAB — SARS-COV-2, NAA 2 DAY TAT

## 2020-12-10 ENCOUNTER — Ambulatory Visit: Payer: BC Managed Care – PPO | Attending: Internal Medicine

## 2020-12-10 DIAGNOSIS — Z20822 Contact with and (suspected) exposure to covid-19: Secondary | ICD-10-CM

## 2020-12-12 LAB — NOVEL CORONAVIRUS, NAA: SARS-CoV-2, NAA: NOT DETECTED

## 2020-12-12 LAB — SARS-COV-2, NAA 2 DAY TAT

## 2021-04-21 ENCOUNTER — Other Ambulatory Visit: Payer: Self-pay | Admitting: Internal Medicine

## 2021-04-21 DIAGNOSIS — E7800 Pure hypercholesterolemia, unspecified: Secondary | ICD-10-CM

## 2021-05-25 ENCOUNTER — Ambulatory Visit
Admission: RE | Admit: 2021-05-25 | Discharge: 2021-05-25 | Disposition: A | Discharge status: Home / Self Care | Payer: No Typology Code available for payment source | Source: Ambulatory Visit | Attending: Internal Medicine | Admitting: Internal Medicine

## 2021-05-25 DIAGNOSIS — E7800 Pure hypercholesterolemia, unspecified: Secondary | ICD-10-CM

## 2021-08-26 ENCOUNTER — Other Ambulatory Visit: Payer: Self-pay | Admitting: Cardiology

## 2021-08-26 DIAGNOSIS — I10 Essential (primary) hypertension: Secondary | ICD-10-CM

## 2022-08-07 IMAGING — CT CT CARDIAC CORONARY ARTERY CALCIUM SCORE
3 series · 14 of 20 positions shown, 16 images · non-contrast
Comparison: None.

CLINICAL DATA: 59-year-old Caucasian male with history of
hyperlipidemia, hypertension and family history of heart disease.

EXAM:
CT CARDIAC CORONARY ARTERY CALCIUM SCORE
TECHNIQUE: Non-contrast imaging through the heart was performed using
prospective ECG gating. Image post processing was performed on an
independent workstation, allowing for quantitative analysis of the
heart and coronary arteries. Note that this exam targets the heart
and the chest was not imaged in its entirety.

[Series 2: calcium scoring 2.00 qr36 bestdiast 69% hrt calciu · axial · 0.44mm/px · z∈[+1582,+1666]mm · 4 of 70 slices shown]
[im 14/70  vessel]
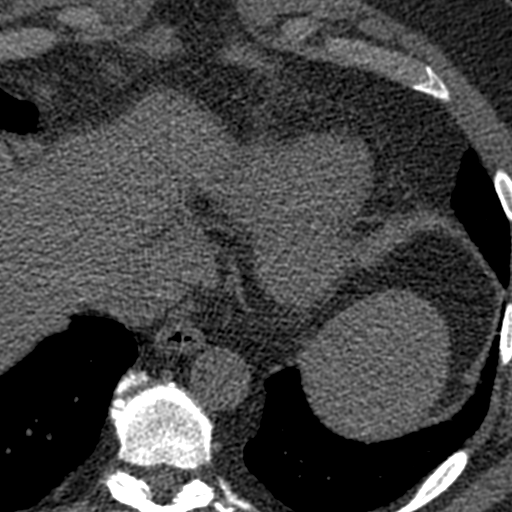
[im 28/70  vessel]
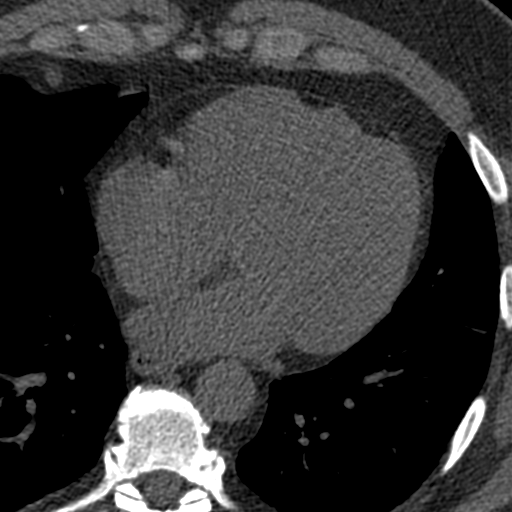
[im 42/70  vessel]
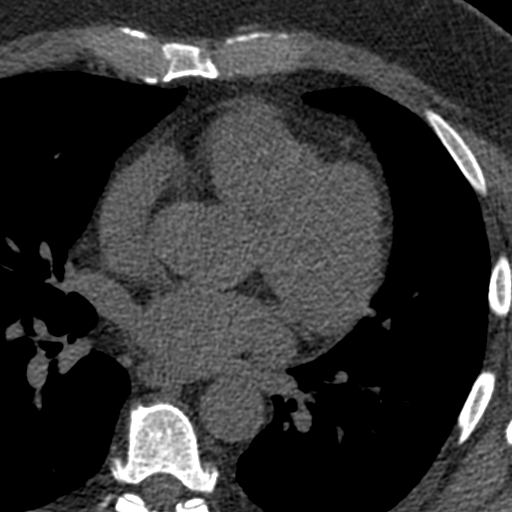
[im 56/70  vessel]
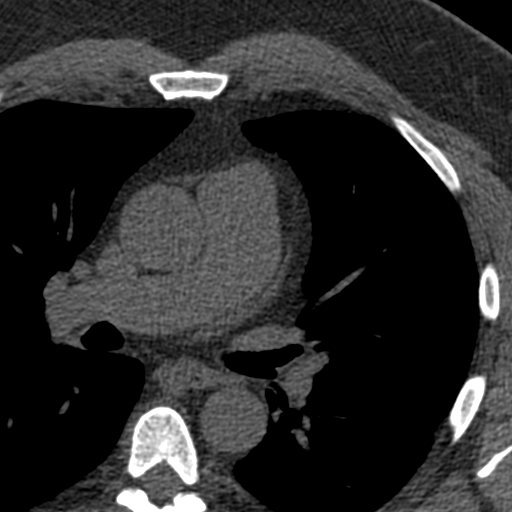

[Series 3: calcium scoring 2.00 br40 bestdiast 69% axial · axial · 0.67mm/px · z∈[+1578,+1670]mm · 5 of 70 slices shown, 7 images]
[im 12/70  vessel]
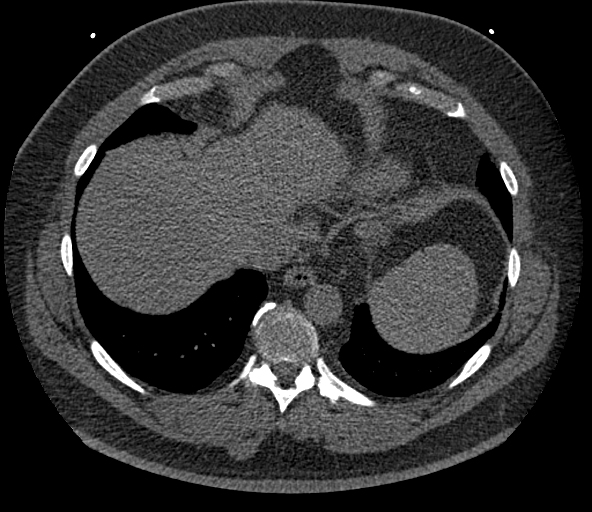
[im 12/70  lung]
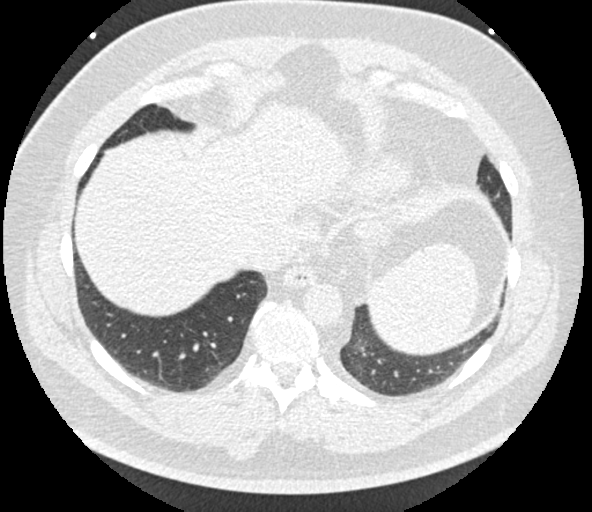
[im 24/70  vessel]
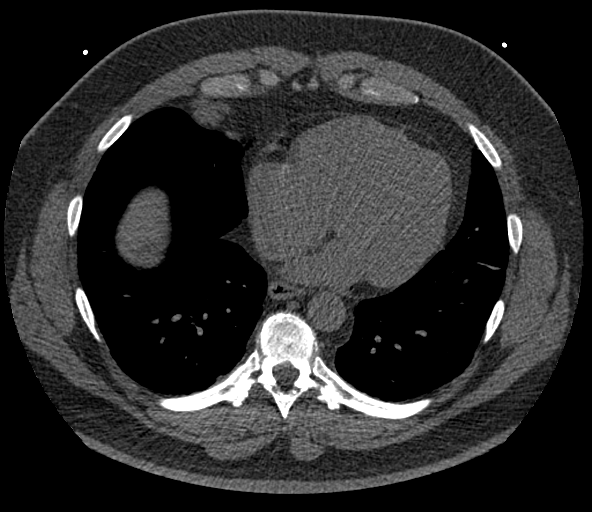
[im 35/70  vessel]
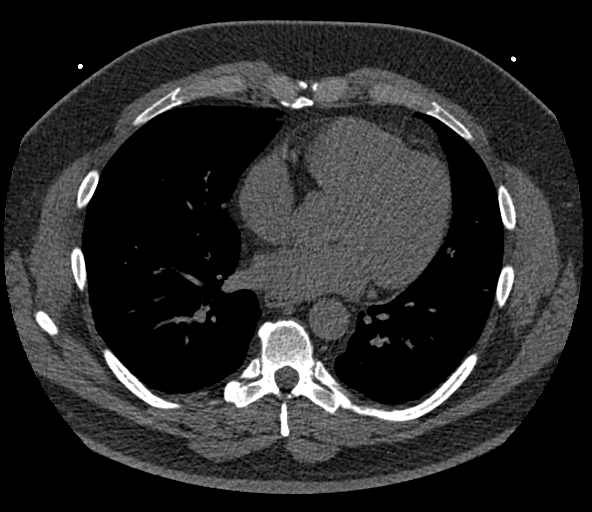
[im 47/70  vessel]
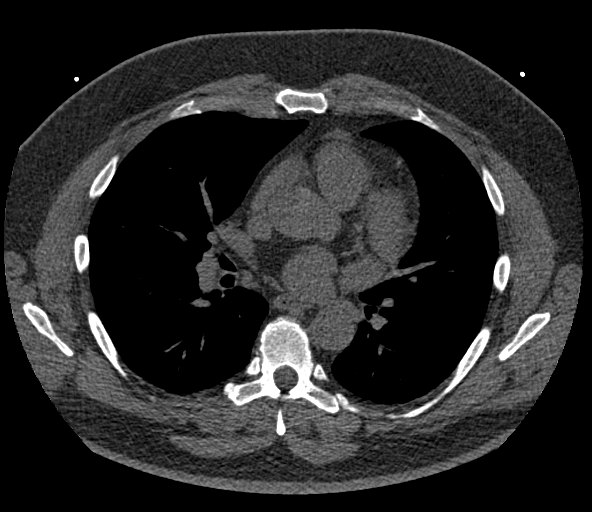
[im 58/70  vessel]
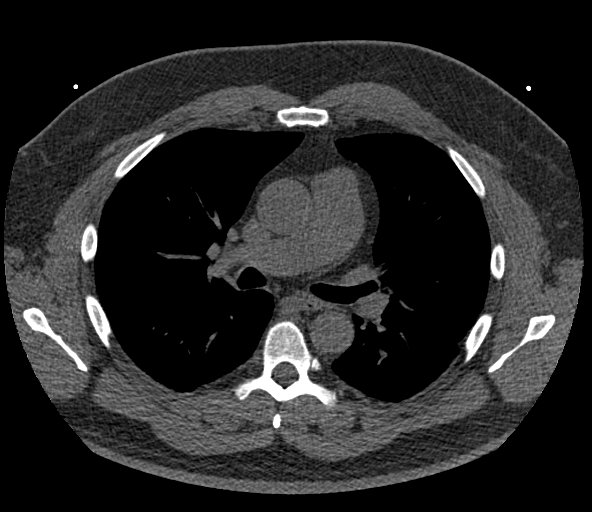
[im 58/70  lung]
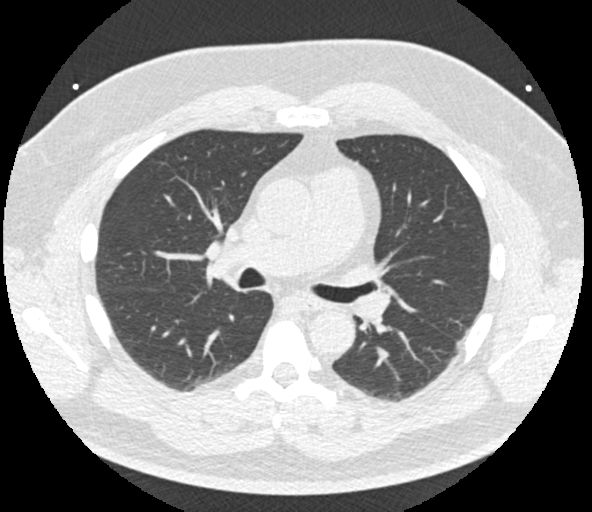

[Series 9: calcium scoring 2.00 br60 bestdiast 69% lungs · axial · 0.67mm/px · z∈[+1578,+1670]mm · 5 of 70 slices shown]
[im 12/70  vessel]
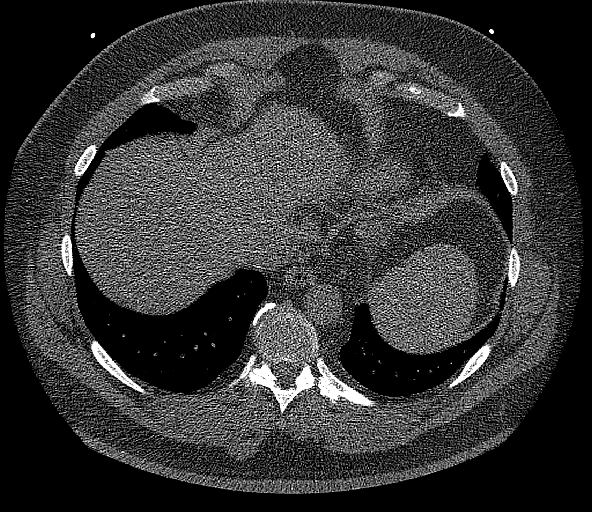
[im 24/70  vessel]
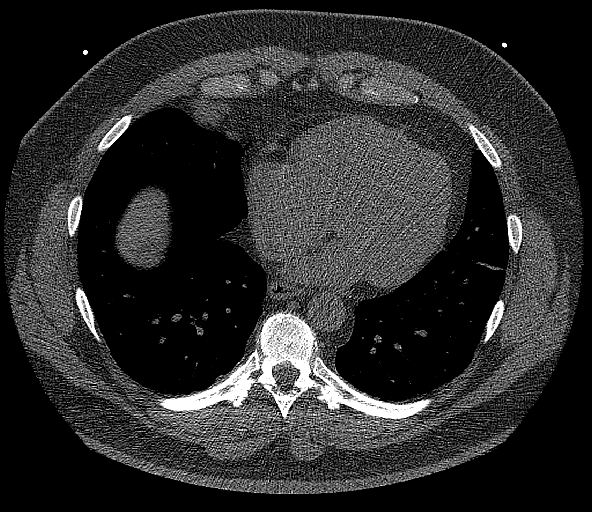
[im 35/70  vessel]
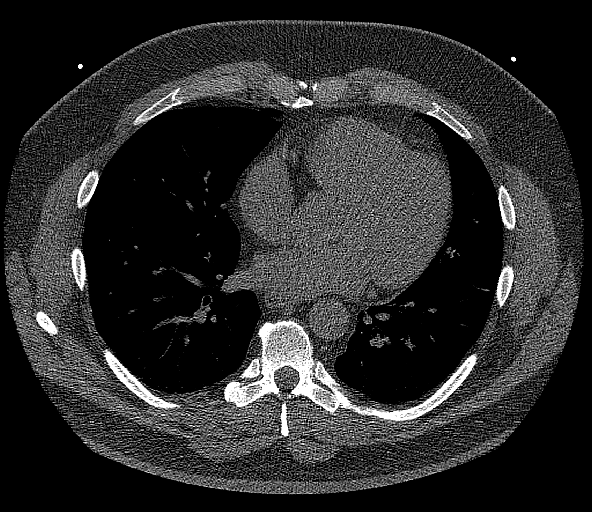
[im 47/70  vessel]
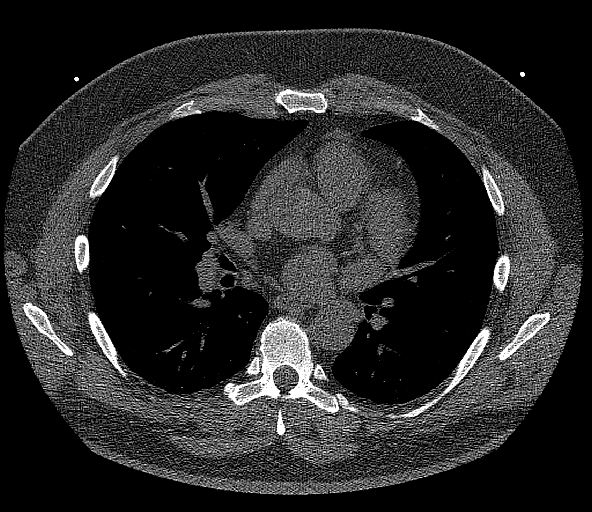
[im 58/70  vessel]
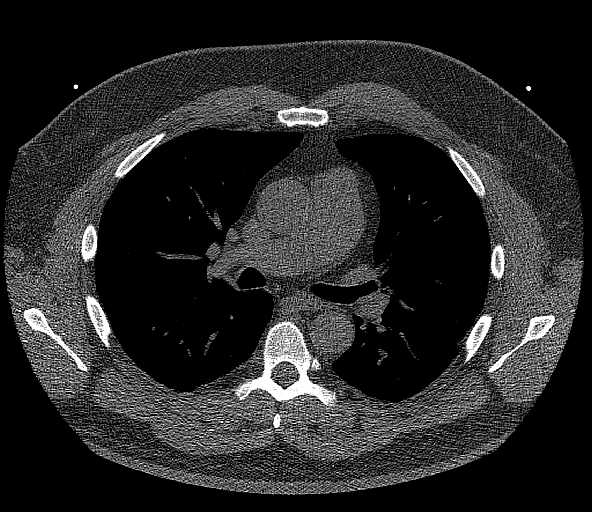

[14 of 20 positions shown; findings below may reference images not displayed]

FINDINGS: CORONARY CALCIUM SCORES:

Left Main: 0

LAD:

LCx:

RCA:

Total Agatston Score:

[HOSPITAL] percentile: 69

AORTA MEASUREMENTS:

Ascending Aorta: 36 mm

Descending Aorta: 27 mm

OTHER FINDINGS:

The heart size is within normal limits. No pericardial fluid is
identified. Visualized segments of the thoracic aorta and central
pulmonary arteries are normal in caliber. Visualized mediastinum and
hilar regions demonstrate no lymphadenopathy or masses. Visualized
lungs show no evidence of pulmonary edema, consolidation,
pneumothorax, nodule or pleural fluid. Visualized upper abdomen and
bony structures are unremarkable.
IMPRESSION: Coronary calcium score 93.8 is at the 69th percentile for the
patient's age, sex and race.

## 2022-09-06 ENCOUNTER — Other Ambulatory Visit: Payer: Self-pay | Admitting: Cardiology

## 2022-09-06 DIAGNOSIS — I10 Essential (primary) hypertension: Secondary | ICD-10-CM

## 2023-04-12 NOTE — Telephone Encounter (Signed)
done
